# Patient Record
Sex: Female | Born: 1950 | Race: White | Hispanic: No | Marital: Married | Smoking: Never smoker
Health system: Southern US, Community
[De-identification: ages and names within clinical notes are randomized; demographics above are authoritative.]

## PROBLEM LIST (undated history)

## (undated) DIAGNOSIS — E782 Mixed hyperlipidemia: Secondary | ICD-10-CM

## (undated) DIAGNOSIS — M1990 Unspecified osteoarthritis, unspecified site: Secondary | ICD-10-CM

## (undated) DIAGNOSIS — Z973 Presence of spectacles and contact lenses: Secondary | ICD-10-CM

## (undated) DIAGNOSIS — R5383 Other fatigue: Secondary | ICD-10-CM

## (undated) DIAGNOSIS — Z8619 Personal history of other infectious and parasitic diseases: Secondary | ICD-10-CM

## (undated) DIAGNOSIS — K219 Gastro-esophageal reflux disease without esophagitis: Secondary | ICD-10-CM

## (undated) DIAGNOSIS — M51369 Other intervertebral disc degeneration, lumbar region without mention of lumbar back pain or lower extremity pain: Secondary | ICD-10-CM

## (undated) DIAGNOSIS — M5136 Other intervertebral disc degeneration, lumbar region: Secondary | ICD-10-CM

## (undated) DIAGNOSIS — D1803 Hemangioma of intra-abdominal structures: Secondary | ICD-10-CM

## (undated) DIAGNOSIS — R112 Nausea with vomiting, unspecified: Secondary | ICD-10-CM

## (undated) DIAGNOSIS — M797 Fibromyalgia: Secondary | ICD-10-CM

## (undated) DIAGNOSIS — I10 Essential (primary) hypertension: Secondary | ICD-10-CM

## (undated) DIAGNOSIS — G510 Bell's palsy: Secondary | ICD-10-CM

## (undated) DIAGNOSIS — Z9889 Other specified postprocedural states: Secondary | ICD-10-CM

## (undated) HISTORY — DX: Personal history of other infectious and parasitic diseases: Z86.19

## (undated) HISTORY — DX: Other intervertebral disc degeneration, lumbar region: M51.36

## (undated) HISTORY — DX: Mixed hyperlipidemia: E78.2

## (undated) HISTORY — DX: Bell's palsy: G51.0

## (undated) HISTORY — DX: Hemangioma of intra-abdominal structures: D18.03

## (undated) HISTORY — DX: Other intervertebral disc degeneration, lumbar region without mention of lumbar back pain or lower extremity pain: M51.369

## (undated) HISTORY — DX: Unspecified osteoarthritis, unspecified site: M19.90

## (undated) HISTORY — PX: TUBAL LIGATION: SHX77

## (undated) HISTORY — DX: Other fatigue: R53.83

## (undated) HISTORY — PX: COLONOSCOPY: SHX174

## (undated) HISTORY — DX: Fibromyalgia: M79.7

## (undated) HISTORY — DX: Essential (primary) hypertension: I10

---

## 2009-09-08 ENCOUNTER — Ambulatory Visit: Payer: Self-pay | Admitting: Family Medicine

## 2011-01-04 ENCOUNTER — Ambulatory Visit: Payer: Self-pay | Admitting: Family Medicine

## 2012-03-20 ENCOUNTER — Ambulatory Visit: Payer: Self-pay | Admitting: Family Medicine

## 2012-05-02 LAB — HM COLONOSCOPY

## 2013-09-16 ENCOUNTER — Ambulatory Visit: Payer: Self-pay | Admitting: Family Medicine

## 2013-09-18 ENCOUNTER — Ambulatory Visit: Payer: Self-pay | Admitting: Family Medicine

## 2014-08-12 ENCOUNTER — Encounter: Payer: Self-pay | Admitting: Family Medicine

## 2014-08-25 ENCOUNTER — Encounter: Payer: Self-pay | Admitting: Family Medicine

## 2014-08-25 ENCOUNTER — Ambulatory Visit (INDEPENDENT_AMBULATORY_CARE_PROVIDER_SITE_OTHER): Payer: BLUE CROSS/BLUE SHIELD | Admitting: Family Medicine

## 2014-08-25 VITALS — BP 132/78 | HR 69 | Temp 97.3°F | Ht 61.2 in | Wt 196.0 lb

## 2014-08-25 DIAGNOSIS — M797 Fibromyalgia: Secondary | ICD-10-CM | POA: Insufficient documentation

## 2014-08-25 DIAGNOSIS — Z0000 Encounter for general adult medical examination without abnormal findings: Secondary | ICD-10-CM

## 2014-08-25 LAB — URINALYSIS, ROUTINE W REFLEX MICROSCOPIC
Bilirubin, UA: NEGATIVE
Glucose, UA: NEGATIVE
Ketones, UA: NEGATIVE
Leukocytes, UA: NEGATIVE
NITRITE UA: NEGATIVE
PH UA: 7 (ref 5.0–7.5)
Protein, UA: NEGATIVE
RBC, UA: NEGATIVE
Specific Gravity, UA: 1.015 (ref 1.005–1.030)
Urobilinogen, Ur: 0.2 mg/dL (ref 0.2–1.0)

## 2014-08-25 MED ORDER — HYDROCHLOROTHIAZIDE 25 MG PO TABS: 25.0000 mg | ORAL_TABLET | Freq: Every day | ORAL | Status: DC

## 2014-08-25 NOTE — Progress Notes (Signed)
BP 132/78 mmHg  Pulse 69  Temp(Src) 97.3 F (36.3 C)  Ht 5' 1.2" (1.554 m)  Wt 196 lb (88.905 kg)  BMI 36.81 kg/m2  SpO2 97%   Subjective:    Patient ID: Jessica Mcgee, female    DOB: 1950/05/08, 64 y.o.   MRN: [id number]  HPI: Jessica Mcgee is a 64 y.o. female  Chief Complaint  Patient presents with  . Annual Exam  . Fibromyalgia  has had a very stressful month and fibromyalgia is worse. Does not want to move  Not taking meloxicam as does not do anything.   Relevant past medical, surgical, family and social history reviewed and updated as indicated. Interim medical history since our last visit reviewed. Allergies and medications reviewed and updated.  Review of Systems  Constitutional: Negative.   HENT: Negative.   Eyes: Negative.   Respiratory: Negative.   Cardiovascular: Negative.   Gastrointestinal: Negative.   Endocrine: Negative.   Genitourinary: Negative.   Musculoskeletal: Negative.   Skin: Negative.   Allergic/Immunologic: Negative.   Neurological: Negative.   Hematological: Negative.   Psychiatric/Behavioral: Negative.     Per HPI unless specifically indicated above     Objective:    BP 132/78 mmHg  Pulse 69  Temp(Src) 97.3 F (36.3 C)  Ht 5' 1.2" (1.554 m)  Wt 196 lb (88.905 kg)  BMI 36.81 kg/m2  SpO2 97%  Wt Readings from Last 3 Encounters:  08/25/14 196 lb (88.905 kg)  07/03/14 195 lb (88.451 kg)    Physical Exam  Constitutional: She is oriented to person, place, and time. She appears well-developed and well-nourished.  HENT:  Head: Normocephalic and atraumatic.  Right Ear: External ear normal.  Left Ear: External ear normal.  Nose: Nose normal.  Mouth/Throat: Oropharynx is clear and moist.  Eyes: Conjunctivae and EOM are normal. Pupils are equal, round, and reactive to light.  Neck: Normal range of motion. Neck supple. Carotid bruit is not present.  Cardiovascular: Normal rate, regular rhythm and normal heart sounds.   No  murmur heard. Pulmonary/Chest: Effort normal and breath sounds normal. Right breast exhibits no mass and no tenderness. Left breast exhibits no mass and no tenderness. Breasts are symmetrical.  Abdominal: Soft. Bowel sounds are normal. There is no hepatosplenomegaly.  Musculoskeletal: Normal range of motion.  Neurological: She is alert and oriented to person, place, and time.  Skin: No rash noted.  Psychiatric: She has a normal mood and affect. Her behavior is normal. Judgment and thought content normal.    No results found for this or any previous visit.    Assessment & Plan:   Problem List Items Addressed This Visit      Musculoskeletal and Integument   Fibromyalgia - Primary (Chronic)    In middle of flair,  Will rest and exercise,  Check labs and vit levels. Pay attn nutrition      Relevant Medications   hydrochlorothiazide (HYDRODIURIL) 25 MG tablet   Other Relevant Orders   Comprehensive metabolic panel   CBC with Differential/Platelet   Urinalysis, Routine w reflex microscopic (not at [facility])   TSH   Lipid panel   Calcitriol (1,25 di-OH Vit D)   Vitamin B12    Other Visit Diagnoses    PE (physical exam), annual        Relevant Medications    hydrochlorothiazide (HYDRODIURIL) 25 MG tablet    Other Relevant Orders    Comprehensive metabolic panel    CBC with Differential/Platelet  Urinalysis, Routine w reflex microscopic (not at [facility])    TSH    Lipid panel    Calcitriol (1,25 di-OH Vit D)    Vitamin B12        Follow up plan: Return if symptoms worsen or fail to improve, for otherwise 6 mo.

## 2014-08-25 NOTE — Assessment & Plan Note (Signed)
In middle of flair,  Will rest and exercise,  Check labs and vit levels. Pay attn nutrition

## 2014-08-26 LAB — LIPID PANEL
CHOL/HDL RATIO: 3.1 ratio (ref 0.0–4.4)
CHOLESTEROL TOTAL: 207 mg/dL — AB (ref 100–199)
HDL: 67 mg/dL (ref >39)
LDL Calculated: 102 mg/dL — ABNORMAL HIGH (ref 0–99)
TRIGLYCERIDES: 191 mg/dL — AB (ref 0–149)
VLDL Cholesterol Cal: 38 mg/dL (ref 5–40)

## 2014-08-26 LAB — CBC WITH DIFFERENTIAL/PLATELET
BASOS ABS: 0 10*3/uL (ref 0.0–0.2)
Basos: 1 %
EOS (ABSOLUTE): 0.1 10*3/uL (ref 0.0–0.4)
Eos: 1 %
HEMATOCRIT: 40.2 % (ref 34.0–46.6)
Hemoglobin: 13.4 g/dL (ref 11.1–15.9)
Immature Grans (Abs): 0 10*3/uL (ref 0.0–0.1)
Immature Granulocytes: 0 %
LYMPHS: 38 %
Lymphocytes Absolute: 2.9 10*3/uL (ref 0.7–3.1)
MCH: 28.9 pg (ref 26.6–33.0)
MCHC: 33.3 g/dL (ref 31.5–35.7)
MCV: 87 fL (ref 79–97)
MONOCYTES: 8 %
MONOS ABS: 0.6 10*3/uL (ref 0.1–0.9)
Neutrophils Absolute: 4.1 10*3/uL (ref 1.4–7.0)
Neutrophils: 52 %
PLATELETS: 248 10*3/uL (ref 150–379)
RBC: 4.64 x10E6/uL (ref 3.77–5.28)
RDW: 14 % (ref 12.3–15.4)
WBC: 7.8 10*3/uL (ref 3.4–10.8)

## 2014-08-26 LAB — COMPREHENSIVE METABOLIC PANEL
A/G RATIO: 1.6 (ref 1.1–2.5)
ALT: 29 IU/L (ref 0–32)
AST: 22 IU/L (ref 0–40)
Albumin: 4 g/dL (ref 3.6–4.8)
Alkaline Phosphatase: 47 IU/L (ref 39–117)
BUN / CREAT RATIO: 26 (ref 11–26)
BUN: 16 mg/dL (ref 8–27)
Bilirubin Total: 0.3 mg/dL (ref 0.0–1.2)
CALCIUM: 9.8 mg/dL (ref 8.7–10.3)
CO2: 27 mmol/L (ref 18–29)
Chloride: 98 mmol/L (ref 97–108)
Creatinine, Ser: 0.61 mg/dL (ref 0.57–1.00)
GFR calc Af Amer: 113 mL/min/{1.73_m2} (ref >59)
GFR calc non Af Amer: 98 mL/min/{1.73_m2} (ref >59)
Globulin, Total: 2.5 g/dL (ref 1.5–4.5)
Glucose: 96 mg/dL (ref 65–99)
Potassium: 3.7 mmol/L (ref 3.5–5.2)
SODIUM: 139 mmol/L (ref 134–144)
Total Protein: 6.5 g/dL (ref 6.0–8.5)

## 2014-08-26 LAB — VITAMIN B12: Vitamin B-12: 1196 pg/mL — ABNORMAL HIGH (ref 211–946)

## 2014-08-26 LAB — TSH: TSH: 2.3 u[IU]/mL (ref 0.450–4.500)

## 2014-08-26 LAB — VITAMIN D 25 HYDROXY (VIT D DEFICIENCY, FRACTURES): Vit D, 25-Hydroxy: 41.9 ng/mL (ref 30.0–100.0)

## 2014-08-27 ENCOUNTER — Telehealth: Payer: Self-pay | Admitting: Family Medicine

## 2014-08-27 DIAGNOSIS — M797 Fibromyalgia: Secondary | ICD-10-CM

## 2014-08-27 NOTE — Telephone Encounter (Signed)
-----   Message from Wynn Maudlin, [employer] sent at 08/27/2014  4:53 PM EDT ----- Line [phone]

## 2014-08-27 NOTE — Telephone Encounter (Signed)
pts labs normal  Discussed fibromyalgia worse will refer to rheumatology

## 2014-08-27 NOTE — Telephone Encounter (Signed)
-----   Message from Wynn Maudlin, [place] sent at 08/27/2014  4:53 PM EDT ----- Line [phone]

## 2015-02-10 ENCOUNTER — Encounter: Payer: Self-pay | Admitting: Family Medicine

## 2015-02-10 ENCOUNTER — Ambulatory Visit (INDEPENDENT_AMBULATORY_CARE_PROVIDER_SITE_OTHER): Payer: BLUE CROSS/BLUE SHIELD | Admitting: Family Medicine

## 2015-02-10 VITALS — BP 112/67 | HR 73 | Temp 98.5°F | Ht 61.3 in | Wt 203.0 lb

## 2015-02-10 DIAGNOSIS — I10 Essential (primary) hypertension: Secondary | ICD-10-CM

## 2015-02-10 DIAGNOSIS — M797 Fibromyalgia: Secondary | ICD-10-CM | POA: Diagnosis not present

## 2015-02-10 DIAGNOSIS — Z1239 Encounter for other screening for malignant neoplasm of breast: Secondary | ICD-10-CM | POA: Diagnosis not present

## 2015-02-10 NOTE — Progress Notes (Signed)
BP 112/67 mmHg  Pulse 73  Temp(Src) 98.5 F (36.9 C)  Ht 5' 1.3" (1.557 m)  Wt 203 lb (92.08 kg)  BMI 37.98 kg/m2  SpO2 99%   Subjective:    Patient ID: Jessica Mcgee, female    DOB: [birth year], 64 y.o.   MRN: [id number]  HPI: [name] is a 64 y.o. female  Chief Complaint  Patient presents with  . Fibromyalgia   she doing okay with fibromyalgia Has occasional bad day needs handicap sticker for parking on those days when feet hurt for any further walking. Blood pressure doing okay with medications no issues Other conditions seem to be stable for now Agents had a lot of stress but is managing  Relevant past medical, surgical, family and social history reviewed and updated as indicated. Interim medical history since our last visit reviewed. Allergies and medications reviewed and updated.  Review of Systems  Constitutional: Negative.   Respiratory: Negative.   Cardiovascular: Negative.     Per HPI unless specifically indicated above     Objective:    BP 112/67 mmHg  Pulse 73  Temp(Src) 98.5 F (36.9 C)  Ht 5' 1.3" (1.557 m)  Wt 203 lb (92.08 kg)  BMI 37.98 kg/m2  SpO2 99%  Wt Readings from Last 3 Encounters:  02/10/15 203 lb (92.08 kg)  08/25/14 196 lb (88.905 kg)  07/03/14 195 lb (88.451 kg)    Physical Exam  Constitutional: She is oriented to person, place, and time. She appears well-developed and well-nourished. No distress.  HENT:  Head: Normocephalic and atraumatic.  Right Ear: Hearing normal.  Left Ear: Hearing normal.  Nose: Nose normal.  Eyes: Conjunctivae and lids are normal. Right eye exhibits no discharge. Left eye exhibits no discharge. No scleral icterus.  Cardiovascular: Normal rate, regular rhythm and normal heart sounds.   Pulmonary/Chest: Effort normal and breath sounds normal. No respiratory distress.  Musculoskeletal: Normal range of motion.  Neurological: She is alert and oriented to person, place, and time.  Skin: Skin is  intact. No rash noted.  Psychiatric: She has a normal mood and affect. Her speech is normal and behavior is normal. Judgment and thought content normal. Cognition and memory are normal.    Results for orders placed or performed in visit on 08/25/14  Comprehensive metabolic panel  Result Value Ref Range   Glucose 96 65 - 99 mg/dL   BUN 16 8 - 27 mg/dL   Creatinine, Ser 0.61 0.57 - 1.00 mg/dL   GFR calc non Af Amer 98 >59 mL/min/1.73   GFR calc Af Amer 113 >59 mL/min/1.73   BUN/Creatinine Ratio 26 11 - 26   Sodium 139 134 - 144 mmol/L   Potassium 3.7 3.5 - 5.2 mmol/L   Chloride 98 97 - 108 mmol/L   CO2 27 18 - 29 mmol/L   Calcium 9.8 8.7 - 10.3 mg/dL   Total Protein 6.5 6.0 - 8.5 g/dL   Albumin 4.0 3.6 - 4.8 g/dL   Globulin, Total 2.5 1.5 - 4.5 g/dL   Albumin/Globulin Ratio 1.6 1.1 - 2.5   Bilirubin Total 0.3 0.0 - 1.2 mg/dL   Alkaline Phosphatase 47 39 - 117 IU/L   AST 22 0 - 40 IU/L   ALT 29 0 - 32 IU/L  CBC with Differential/Platelet  Result Value Ref Range   WBC 7.8 3.4 - 10.8 x10E3/uL   RBC 4.64 3.77 - 5.28 x10E6/uL   Hemoglobin 13.4 11.1 - 15.9 g/dL   Hematocrit  40.2 34.0 - 46.6 %   MCV 87 79 - 97 fL   MCH 28.9 26.6 - 33.0 pg   MCHC 33.3 31.5 - 35.7 g/dL   RDW 14.0 12.3 - 15.4 %   Platelets 248 150 - 379 x10E3/uL   Neutrophils 52 %   Lymphs 38 %   Monocytes 8 %   Eos 1 %   Basos 1 %   Neutrophils Absolute 4.1 1.4 - 7.0 x10E3/uL   Lymphocytes Absolute 2.9 0.7 - 3.1 x10E3/uL   Monocytes Absolute 0.6 0.1 - 0.9 x10E3/uL   EOS (ABSOLUTE) 0.1 0.0 - 0.4 x10E3/uL   Basophils Absolute 0.0 0.0 - 0.2 x10E3/uL   Immature Granulocytes 0 %   Immature Grans (Abs) 0.0 0.0 - 0.1 x10E3/uL  Urinalysis, Routine w reflex microscopic (not at [facility])  Result Value Ref Range   Specific Gravity, UA 1.015 1.005 - 1.030   pH, UA 7.0 5.0 - 7.5   Color, UA Yellow Yellow   Appearance Ur Clear Clear   Leukocytes, UA Negative Negative   Protein, UA Negative Negative/Trace   Glucose, UA  Negative Negative   Ketones, UA Negative Negative   RBC, UA Negative Negative   Bilirubin, UA Negative Negative   Urobilinogen, Ur 0.2 0.2 - 1.0 mg/dL   Nitrite, UA Negative Negative  TSH  Result Value Ref Range   TSH 2.300 0.450 - 4.500 uIU/mL  Lipid panel  Result Value Ref Range   Cholesterol, Total 207 (H) 100 - 199 mg/dL   Triglycerides 191 (H) 0 - 149 mg/dL   HDL 67 >39 mg/dL   VLDL Cholesterol Cal 38 5 - 40 mg/dL   LDL Calculated 102 (H) 0 - 99 mg/dL   Chol/HDL Ratio 3.1 0.0 - 4.4 ratio units  Vitamin B12  Result Value Ref Range   Vitamin B-12 1196 (H) 211 - 946 pg/mL  Vit D  25 hydroxy (rtn osteoporosis monitoring)  Result Value Ref Range   Vit D, 25-Hydroxy 41.9 30.0 - 100.0 ng/mL      Assessment & Plan:   Problem List Items Addressed This Visit      Cardiovascular and Mediastinum   Essential hypertension    The current medical regimen is effective;  continue present plan and medications.       Relevant Orders   Basic metabolic panel     Musculoskeletal and Integument   Fibromyalgia (Chronic)    The current medical regimen is effective;  continue present plan and medications.        Other Visit Diagnoses    Breast cancer screening    -  Primary    Relevant Orders    MM Digital Screening        Follow up plan: Return in about 6 months (around 08/11/2015) for Physical Exam.

## 2015-02-10 NOTE — Assessment & Plan Note (Signed)
The current medical regimen is effective;  continue present plan and medications.  

## 2015-02-11 ENCOUNTER — Telehealth: Payer: Self-pay | Admitting: Family Medicine

## 2015-02-11 DIAGNOSIS — R7309 Other abnormal glucose: Secondary | ICD-10-CM

## 2015-02-11 DIAGNOSIS — E876 Hypokalemia: Secondary | ICD-10-CM

## 2015-02-11 LAB — BASIC METABOLIC PANEL
BUN / CREAT RATIO: 26 (ref 11–26)
BUN: 16 mg/dL (ref 8–27)
CO2: 26 mmol/L (ref 18–29)
CREATININE: 0.61 mg/dL (ref 0.57–1.00)
Calcium: 9.7 mg/dL (ref 8.7–10.3)
Chloride: 98 mmol/L (ref 97–106)
GFR calc non Af Amer: 97 mL/min/{1.73_m2} (ref >59)
GFR, EST AFRICAN AMERICAN: 112 mL/min/{1.73_m2} (ref >59)
GLUCOSE: 136 mg/dL — AB (ref 65–99)
Potassium: 3.3 mmol/L — ABNORMAL LOW (ref 3.5–5.2)
Sodium: 139 mmol/L (ref 136–144)

## 2015-02-11 NOTE — Telephone Encounter (Signed)
-----   Message from Wynn Maudlin, [place] sent at 02/11/2015 12:21 PM EST ----- labs

## 2015-02-11 NOTE — Telephone Encounter (Signed)
Phone call Discussed with patient Elevated glucose and low potassium Patient will increase potassium in diet We will recheck BMP and hemoglobin A1c next month

## 2015-03-15 ENCOUNTER — Ambulatory Visit: Payer: Self-pay

## 2015-03-22 ENCOUNTER — Ambulatory Visit
Admission: RE | Admit: 2015-03-22 | Discharge: 2015-03-22 | Disposition: A | Discharge status: Home / Self Care | Payer: BLUE CROSS/BLUE SHIELD | Source: Ambulatory Visit | Attending: Family Medicine | Admitting: Family Medicine

## 2015-03-22 DIAGNOSIS — Z1231 Encounter for screening mammogram for malignant neoplasm of breast: Secondary | ICD-10-CM | POA: Diagnosis present

## 2015-03-22 DIAGNOSIS — Z1239 Encounter for other screening for malignant neoplasm of breast: Secondary | ICD-10-CM

## 2015-06-04 ENCOUNTER — Encounter: Payer: Self-pay | Admitting: Family Medicine

## 2015-07-14 ENCOUNTER — Other Ambulatory Visit: Payer: Self-pay | Admitting: Family Medicine

## 2015-09-01 ENCOUNTER — Encounter: Payer: BLUE CROSS/BLUE SHIELD | Admitting: Family Medicine

## 2015-10-07 ENCOUNTER — Ambulatory Visit (INDEPENDENT_AMBULATORY_CARE_PROVIDER_SITE_OTHER): Payer: BLUE CROSS/BLUE SHIELD | Admitting: Family Medicine

## 2015-10-07 ENCOUNTER — Encounter: Payer: Self-pay | Admitting: Family Medicine

## 2015-10-07 VITALS — BP 130/76 | HR 74 | Temp 98.0°F | Ht 60.7 in | Wt 204.0 lb

## 2015-10-07 DIAGNOSIS — M797 Fibromyalgia: Secondary | ICD-10-CM

## 2015-10-07 DIAGNOSIS — Z124 Encounter for screening for malignant neoplasm of cervix: Secondary | ICD-10-CM | POA: Diagnosis not present

## 2015-10-07 DIAGNOSIS — Z0000 Encounter for general adult medical examination without abnormal findings: Secondary | ICD-10-CM | POA: Diagnosis not present

## 2015-10-07 DIAGNOSIS — I10 Essential (primary) hypertension: Secondary | ICD-10-CM | POA: Diagnosis not present

## 2015-10-07 DIAGNOSIS — K219 Gastro-esophageal reflux disease without esophagitis: Secondary | ICD-10-CM | POA: Diagnosis not present

## 2015-10-07 LAB — URINALYSIS, ROUTINE W REFLEX MICROSCOPIC
BILIRUBIN UA: NEGATIVE
Glucose, UA: NEGATIVE
Ketones, UA: NEGATIVE
NITRITE UA: NEGATIVE
PH UA: 6 (ref 5.0–7.5)
Protein, UA: NEGATIVE
RBC UA: NEGATIVE
Specific Gravity, UA: 1.015 (ref 1.005–1.030)
UUROB: 0.2 mg/dL (ref 0.2–1.0)

## 2015-10-07 LAB — MICROSCOPIC EXAMINATION

## 2015-10-07 MED ORDER — HYDROCHLOROTHIAZIDE 25 MG PO TABS: 25.0000 mg | ORAL_TABLET | Freq: Every day | ORAL | 4 refills | Status: DC

## 2015-10-07 NOTE — Assessment & Plan Note (Signed)
The current medical regimen is effective;  continue present plan and medications.  

## 2015-10-07 NOTE — Progress Notes (Signed)
BP 130/76 (BP Location: Left Arm, Patient Position: Sitting, Cuff Size: Normal)   Pulse 74   Temp 98 F (36.7 C)   Ht 5' 0.7" (1.542 m)   Wt 204 lb (92.5 kg)   SpO2 98%   BMI 38.93 kg/m    Subjective:    Patient ID: Jessica Mcgee, female    DOB: June 19, 1950, 65 y.o.   MRN: [id number]  HPI: Jessica Mcgee is a 65 y.o. female  Annual exam Patient follow-up medical exam doing well blood pressure hydrochlorothiazide no complaints takes faithfully every day. Reflux gastric hemangiomas well controlled with alternating Prilosec with Pepcid. Does well no complaints from medications no GI complaints no bleeding. Ambien last prescription April 2016 still many pills left and bottle takes rare pill and only a portion of the 10 mg pill. Fibromyalgia continues to be a problem but manages okay  Relevant past medical, surgical, family and social history reviewed and updated as indicated. Interim medical history since our last visit reviewed. Allergies and medications reviewed and updated.  Review of Systems  Constitutional: Negative.   HENT: Negative.   Eyes: Negative.   Respiratory: Negative.   Cardiovascular: Negative.   Gastrointestinal: Negative.   Endocrine: Negative.   Genitourinary: Negative.   Musculoskeletal: Negative.   Skin: Negative.   Allergic/Immunologic: Negative.   Neurological: Negative.   Hematological: Negative.   Psychiatric/Behavioral: Negative.     Per HPI unless specifically indicated above     Objective:    BP 130/76 (BP Location: Left Arm, Patient Position: Sitting, Cuff Size: Normal)   Pulse 74   Temp 98 F (36.7 C)   Ht 5' 0.7" (1.542 m)   Wt 204 lb (92.5 kg)   SpO2 98%   BMI 38.93 kg/m   Wt Readings from Last 3 Encounters:  10/07/15 204 lb (92.5 kg)  02/10/15 203 lb (92.1 kg)  08/25/14 196 lb (88.9 kg)    Physical Exam  Constitutional: She is oriented to person, place, and time. She appears well-developed and well-nourished.  HENT:  Head:  Normocephalic and atraumatic.  Right Ear: External ear normal.  Left Ear: External ear normal.  Nose: Nose normal.  Mouth/Throat: Oropharynx is clear and moist.  Eyes: Conjunctivae and EOM are normal. Pupils are equal, round, and reactive to light.  Neck: Normal range of motion. Neck supple. Carotid bruit is not present.  Cardiovascular: Normal rate, regular rhythm and normal heart sounds.   No murmur heard. Pulmonary/Chest: Effort normal and breath sounds normal. She exhibits no mass. Right breast exhibits no mass, no skin change and no tenderness. Left breast exhibits no mass, no skin change and no tenderness. Breasts are symmetrical.  Abdominal: Soft. Bowel sounds are normal. There is no hepatosplenomegaly.  Genitourinary: Vagina normal and uterus normal. No vaginal discharge found.  Genitourinary Comments: Normal bimanual exam Pap  performed  Musculoskeletal: Normal range of motion.  Neurological: She is alert and oriented to person, place, and time.  Skin: No rash noted.  Psychiatric: She has a normal mood and affect. Her behavior is normal. Judgment and thought content normal.    Results for orders placed or performed in visit on [id number]  Basic metabolic panel  Result Value Ref Range   Glucose 136 (H) 65 - 99 mg/dL   BUN 16 8 - 27 mg/dL   Creatinine, Ser 0.61 0.57 - 1.00 mg/dL   GFR calc non Af Amer 97 >59 mL/min/1.73   GFR calc Af Amer 112 >59 mL/min/1.73  BUN/Creatinine Ratio 26 11 - 26   Sodium 139 136 - 144 mmol/L   Potassium 3.3 (L) 3.5 - 5.2 mmol/L   Chloride 98 97 - 106 mmol/L   CO2 26 18 - 29 mmol/L   Calcium 9.7 8.7 - 10.3 mg/dL      Assessment & Plan:   Problem List Items Addressed This Visit      Cardiovascular and Mediastinum   Essential hypertension    The current medical regimen is effective;  continue present plan and medications.         Digestive   Acid reflux    The current medical regimen is effective;  continue present plan and  medications.         Musculoskeletal and Integument   Fibromyalgia (Chronic)    The current medical regimen is effective;  continue present plan and medications.        Other Visit Diagnoses    Annual physical exam    -  Primary   Relevant Orders   CBC with Differential/Platelet   Comprehensive metabolic panel   Lipid Panel w/o Chol/HDL Ratio   TSH   Urinalysis, Routine w reflex microscopic (not at [facility])   Healthcare maintenance       Relevant Orders   Hepatitis C Antibody   Pap smear for cervical cancer screening       Relevant Orders   IGP, Aptima HPV, rfx 16/18,45       Follow up plan: Return in about 6 months (around 04/08/2016) for BMP BP check.

## 2015-10-08 ENCOUNTER — Telehealth: Payer: Self-pay

## 2015-10-08 LAB — CBC WITH DIFFERENTIAL/PLATELET
BASOS: 0 %
Basophils Absolute: 0 10*3/uL (ref 0.0–0.2)
EOS (ABSOLUTE): 0.1 10*3/uL (ref 0.0–0.4)
EOS: 1 %
HEMATOCRIT: 40.2 % (ref 34.0–46.6)
Hemoglobin: 13.2 g/dL (ref 11.1–15.9)
Immature Grans (Abs): 0 10*3/uL (ref 0.0–0.1)
Immature Granulocytes: 0 %
LYMPHS ABS: 3 10*3/uL (ref 0.7–3.1)
Lymphs: 32 %
MCH: 29.2 pg (ref 26.6–33.0)
MCHC: 32.8 g/dL (ref 31.5–35.7)
MCV: 89 fL (ref 79–97)
MONOS ABS: 0.6 10*3/uL (ref 0.1–0.9)
Monocytes: 6 %
NEUTROS ABS: 5.5 10*3/uL (ref 1.4–7.0)
Neutrophils: 61 %
Platelets: 282 10*3/uL (ref 150–379)
RBC: 4.52 x10E6/uL (ref 3.77–5.28)
RDW: 13.7 % (ref 12.3–15.4)
WBC: 9.2 10*3/uL (ref 3.4–10.8)

## 2015-10-08 LAB — COMPREHENSIVE METABOLIC PANEL
A/G RATIO: 1.5 (ref 1.2–2.2)
ALK PHOS: 57 IU/L (ref 39–117)
ALT: 29 IU/L (ref 0–32)
AST: 23 IU/L (ref 0–40)
Albumin: 4.1 g/dL (ref 3.6–4.8)
BILIRUBIN TOTAL: 0.2 mg/dL (ref 0.0–1.2)
BUN/Creatinine Ratio: 31 — ABNORMAL HIGH (ref 12–28)
BUN: 19 mg/dL (ref 8–27)
CO2: 27 mmol/L (ref 18–29)
Calcium: 9.5 mg/dL (ref 8.7–10.3)
Chloride: 97 mmol/L (ref 96–106)
Creatinine, Ser: 0.62 mg/dL (ref 0.57–1.00)
GFR calc Af Amer: 111 mL/min/{1.73_m2} (ref >59)
GFR calc non Af Amer: 96 mL/min/{1.73_m2} (ref >59)
GLOBULIN, TOTAL: 2.7 g/dL (ref 1.5–4.5)
Glucose: 91 mg/dL (ref 65–99)
POTASSIUM: 3.7 mmol/L (ref 3.5–5.2)
SODIUM: 141 mmol/L (ref 134–144)
Total Protein: 6.8 g/dL (ref 6.0–8.5)

## 2015-10-08 LAB — LIPID PANEL W/O CHOL/HDL RATIO
CHOLESTEROL TOTAL: 204 mg/dL — AB (ref 100–199)
HDL: 54 mg/dL (ref >39)
LDL Calculated: 111 mg/dL — ABNORMAL HIGH (ref 0–99)
TRIGLYCERIDES: 196 mg/dL — AB (ref 0–149)
VLDL Cholesterol Cal: 39 mg/dL (ref 5–40)

## 2015-10-08 LAB — HEPATITIS C ANTIBODY: Hep C Virus Ab: <0.1 s/co ratio (ref 0.0–0.9)

## 2015-10-08 LAB — TSH: TSH: 2.04 u[IU]/mL (ref 0.450–4.500)

## 2015-10-08 NOTE — Telephone Encounter (Signed)
Patient reported to me at her CPE 10/07/15 that she had had another colonoscopy since our last documented one in 2010.  LVM requesting report from that procedure for documentation  Fax [phone], we may need to have patient sign release, but we were the original referring practice.

## 2015-10-11 ENCOUNTER — Encounter: Payer: Self-pay | Admitting: Family Medicine

## 2015-10-12 LAB — IGP, APTIMA HPV, RFX 16/18,45
HPV Aptima: NEGATIVE
PAP Smear Comment: 0

## 2016-03-21 ENCOUNTER — Other Ambulatory Visit: Payer: Self-pay | Admitting: Family Medicine

## 2016-03-21 DIAGNOSIS — Z1231 Encounter for screening mammogram for malignant neoplasm of breast: Secondary | ICD-10-CM

## 2016-03-27 ENCOUNTER — Ambulatory Visit
Admission: RE | Admit: 2016-03-27 | Discharge: 2016-03-27 | Disposition: A | Discharge status: Home / Self Care | Payer: BLUE CROSS/BLUE SHIELD | Source: Ambulatory Visit | Attending: Family Medicine | Admitting: Family Medicine

## 2016-03-27 DIAGNOSIS — Z1231 Encounter for screening mammogram for malignant neoplasm of breast: Secondary | ICD-10-CM | POA: Insufficient documentation

## 2016-04-19 ENCOUNTER — Ambulatory Visit (INDEPENDENT_AMBULATORY_CARE_PROVIDER_SITE_OTHER): Payer: BLUE CROSS/BLUE SHIELD | Admitting: Family Medicine

## 2016-04-19 VITALS — BP 120/72 | HR 68 | Ht 62.0 in | Wt 207.0 lb

## 2016-04-19 DIAGNOSIS — M797 Fibromyalgia: Secondary | ICD-10-CM | POA: Diagnosis not present

## 2016-04-19 DIAGNOSIS — D1803 Hemangioma of intra-abdominal structures: Secondary | ICD-10-CM | POA: Diagnosis not present

## 2016-04-19 DIAGNOSIS — I10 Essential (primary) hypertension: Secondary | ICD-10-CM | POA: Diagnosis not present

## 2016-04-19 NOTE — Progress Notes (Signed)
   BP 120/72   Pulse 68   Ht 5\' 2"  (1.575 m)   Wt 207 lb (93.9 kg)   SpO2 98%   BMI 37.86 kg/m    Subjective:    Patient ID: Jessica Mcgee, female    DOB: 21-Aug-1950, 66 y.o.   MRN: [id number]  HPI: Jessica Mcgee is a 66 y.o. female  Chief Complaint  Patient presents with  . Follow-up  . Hypertension  Patient follow-up doing well no complaints taking hydrochlorothiazide without problems and potassium supplement. Good blood pressure control no issues. For sleep taking over-the-counter Sam E Uses rare Ambien which I encouraged.  Relevant past medical, surgical, family and social history reviewed and updated as indicated. Interim medical history since our last visit reviewed. Allergies and medications reviewed and updated.  Review of Systems  Constitutional: Negative.   Respiratory: Negative.   Cardiovascular: Negative.     Per HPI unless specifically indicated above     Objective:    BP 120/72   Pulse 68   Ht 5\' 2"  (1.575 m)   Wt 207 lb (93.9 kg)   SpO2 98%   BMI 37.86 kg/m   Wt Readings from Last 3 Encounters:  04/19/16 207 lb (93.9 kg)  10/07/15 204 lb (92.5 kg)  02/10/15 203 lb (92.1 kg)    Physical Exam  Constitutional: She is oriented to person, place, and time. She appears well-developed and well-nourished. No distress.  HENT:  Head: Normocephalic and atraumatic.  Right Ear: Hearing normal.  Left Ear: Hearing normal.  Nose: Nose normal.  Eyes: Conjunctivae and lids are normal. Right eye exhibits no discharge. Left eye exhibits no discharge. No scleral icterus.  Cardiovascular: Normal rate, regular rhythm and normal heart sounds.   Pulmonary/Chest: Effort normal and breath sounds normal. No respiratory distress.  Musculoskeletal: Normal range of motion.  Neurological: She is alert and oriented to person, place, and time.  Skin: Skin is intact. No rash noted.  Psychiatric: She has a normal mood and affect. Her speech is normal and behavior is normal.  Judgment and thought content normal. Cognition and memory are normal.    Results for orders placed or performed in visit on 10/12/15  HM COLONOSCOPY  Result Value Ref Range   HM Colonoscopy See Report See Report, Patient Reported Normal      Assessment & Plan:   Problem List Items Addressed This Visit      Cardiovascular and Mediastinum   Essential hypertension - Primary    The current medical regimen is effective;  continue present plan and medications.       Relevant Orders   Basic metabolic panel     Other   Fibromyalgia (Chronic)    Fibromyalgia doing well taking possibly 10 Fish oil capsules a day without problems      Hemangioma of intra-abdominal structures    Patient on request asking about hemangioma of her liver. At one time this been followed by MRI. Patient with no symptoms signs or any liver complaint on review no metabolic changes of liver function. Reviewed from old practice partner chart and printed out MRI reports to add in the Epic chart of a stable hemangioma. No further imaging or action is been taking since her last MRI. Will check with GI if any further action needs to be taken.          Follow up plan: Return in about 6 months (around 10/17/2016) for Physical Exam.

## 2016-04-19 NOTE — Assessment & Plan Note (Signed)
Fibromyalgia doing well taking possibly 10 Fish oil capsules a day without problems

## 2016-04-19 NOTE — Addendum Note (Signed)
Addended byGolden Pop on: 04/19/2016 02:37 PM   Modules accepted: Orders

## 2016-04-19 NOTE — Assessment & Plan Note (Addendum)
Patient on request asking about hemangioma of her liver. At one time this been followed by MRI. Patient with no symptoms signs or any liver complaint on review no metabolic changes of liver function. Reviewed from old practice partner chart and printed out MRI reports to add in the Epic chart of a stable hemangioma. No further imaging or action is been taking since her last MRI. Will check with GI if any further action needs to be taken.  Discussed with Dr. Vicente Males of GI and will repeat MRI with contrast of abdomen for reassessment of hepatic hemangioma. We will also make an appointment to follow-up with Dr. Vicente Males regarding options for treatment if necessary.

## 2016-04-19 NOTE — Assessment & Plan Note (Signed)
The current medical regimen is effective;  continue present plan and medications.  

## 2016-04-20 ENCOUNTER — Encounter: Payer: Self-pay | Admitting: Family Medicine

## 2016-04-20 LAB — BASIC METABOLIC PANEL
BUN / CREAT RATIO: 25 (ref 12–28)
BUN: 14 mg/dL (ref 8–27)
CHLORIDE: 97 mmol/L (ref 96–106)
CO2: 26 mmol/L (ref 18–29)
CREATININE: 0.57 mg/dL (ref 0.57–1.00)
Calcium: 9.3 mg/dL (ref 8.7–10.3)
GFR calc Af Amer: 114 mL/min/{1.73_m2} (ref >59)
GFR calc non Af Amer: 99 mL/min/{1.73_m2} (ref >59)
GLUCOSE: 79 mg/dL (ref 65–99)
Potassium: 3.5 mmol/L (ref 3.5–5.2)
SODIUM: 141 mmol/L (ref 134–144)

## 2016-05-17 ENCOUNTER — Ambulatory Visit (INDEPENDENT_AMBULATORY_CARE_PROVIDER_SITE_OTHER): Payer: BLUE CROSS/BLUE SHIELD | Admitting: Gastroenterology

## 2016-05-17 ENCOUNTER — Encounter: Payer: Self-pay | Admitting: Gastroenterology

## 2016-05-17 VITALS — BP 128/72 | Ht 62.0 in | Wt 204.8 lb

## 2016-05-17 DIAGNOSIS — D1803 Hemangioma of intra-abdominal structures: Secondary | ICD-10-CM | POA: Diagnosis not present

## 2016-05-17 NOTE — Progress Notes (Signed)
Gastroenterology Consultation  Referring Provider:     Guadalupe Maple, MD Primary Care Physician:  Golden Pop, MD Primary Gastroenterologist:  Dr. Jonathon Bellows  Reason for Consultation:     Hemangioma of liver         HPI:   Jessica Mcgee is a 66 y.o. y/o female referred for consultation & management  by Dr. Golden Pop, MD.    She is known to have a stable hepatic hemangioma per MRI , last imaging back in 2010. It measured 7.3 x 6.8 cm in left lobe of the liver.   Denies any symptoms of abdominal pain. She says that it was initially found when her liver enzymes were high which is normal now. During the work up it was evaluated and she was found to have a hemangioma.   Past Medical History:  Diagnosis Date  . Bell's palsy   . DDD (degenerative disc disease), lumbar   . Fatigue   . Fibromyalgia   . Hemangioma of intra-abdominal structures   . History of Lyme disease     Past Surgical History:  Procedure Laterality Date  . TUBAL LIGATION      Prior to Admission medications   Medication Sig Start Date End Date Taking? Authorizing Provider  famotidine (PEPCID) 10 MG tablet Take 10 mg by mouth daily as needed for heartburn or indigestion.    Historical Provider, MD  hydrochlorothiazide (HYDRODIURIL) 25 MG tablet Take 1 tablet (25 mg total) by mouth daily. 10/07/15   Guadalupe Maple, MD  omeprazole (PRILOSEC) 20 MG capsule Take 20 mg by mouth daily as needed.     Historical Provider, MD  zolpidem (AMBIEN) 10 MG tablet Take 10 mg by mouth at bedtime as needed.     Historical Provider, MD    Family History  Problem Relation Age of Onset  . Hypertension Mother   . Osteoporosis Mother   . Cancer Mother     breast  . Breast cancer Mother     ~9  . Hypertension Father   . Heart attack Father   . Stroke Father   . Kidney disease Maternal Grandmother   . Osteoporosis Maternal Grandmother   . Kidney disease Maternal Grandfather   . Cancer Maternal Grandfather   . Kidney  disease Paternal Grandmother   . Cancer Paternal Grandmother   . Kidney disease Paternal Grandfather   . Breast cancer Paternal Aunt     3 pat aunts     Social History  Substance Use Topics  . Smoking status: Never Smoker  . Smokeless tobacco: Never Used  . Alcohol use 0.0 oz/week     Comment: occassional    Allergies as of 05/17/2016 - Review Complete 04/19/2016  Allergen Reaction Noted  . Cymbalta [duloxetine hcl]  08/25/2014  . Latex  07/03/2014    Review of Systems:    All systems reviewed and negative except where noted in HPI.   Physical Exam:  There were no vitals taken for this visit. No LMP recorded. Patient is postmenopausal. Psych:  Alert and cooperative. Normal mood and affect. General:   Alert,  Well-developed, well-nourished, pleasant and cooperative in NAD Head:  Normocephalic and atraumatic. Eyes:  Sclera clear, no icterus.   Conjunctiva pink. Ears:  Normal auditory acuity. Nose:  No deformity, discharge, or lesions. Mouth:  No deformity or lesions,oropharynx pink & moist. Neck:  Supple; no masses or thyromegaly. Lungs:  Respirations even and unlabored.  Clear throughout to auscultation.   No  wheezes, crackles, or rhonchi. No acute distress. Heart:  Regular rate and rhythm; no murmurs, clicks, rubs, or gallops. Abdomen:  Normal bowel sounds.  No bruits.  Soft, non-tender and non-distended without masses, hepatosplenomegaly or hernias noted.  No guarding or rebound tenderness.    Msk:  Symmetrical without gross deformities. Good, equal movement & strength bilaterally. Pulses:  Normal pulses noted. Extremities:  No clubbing or edema.  No cyanosis. Neurologic:  Alert and oriented x3;  grossly normal neurologically. Psych:  Alert and cooperative. Normal mood and affect.  Imaging Studies: No results found.  Assessment and Plan:   Jessica Mcgee is a 66 y.o. y/o female has been referred for evaluation of a hepatic hemangioma which was stable on MRI till  2010. She has had a repeat MRI recently and shows the largest hemangioma at 6.2 cm which seems to be stable but there are two other hemangiomas also seem which am not sure if were present previously or are new. I am going to try and get the radiologist to compare the two MRI , the present one and the one from 2010 . If stable then needs no further follow up , if there has been a change then would likely need surveillance . Presently no indication for any resection.     Follow up as needed   Dr Jonathon Bellows MD

## 2016-05-18 ENCOUNTER — Telehealth: Payer: Self-pay | Admitting: Gastroenterology

## 2016-05-18 NOTE — Telephone Encounter (Signed)
I spoke to radiology and they said that they do not have images from 2010 MRI  as the radiology practice merged.   Hence we would not be able to compare the two MRI from 2010 and 2018 to check if the hemangiomas havnt changed.   Only option I can see is to repeat MRI in 6-12 months to check for stability of the lesions  Panya ,please inform the patient about this and let me know her thoughts.   C/c Dr Jeananne Rama  Dr Jonathon Bellows  Gastroenterology/Hepatology Pager: [phone]

## 2016-05-18 NOTE — Telephone Encounter (Signed)
-----   Message from Jonathon Bellows, MD sent at 05/18/2016  9:08 AM EDT ----- Regarding: RE: MRI Comparison I called radiology and they will get both films and have it compared. If they call when am on vacation please send me an EPIC message so I can follow up   Jessica Mcgee  ----- Message ----- From: Leontine Locket, CMA Sent: 05/17/2016   3:52 PM To: Jonathon Bellows, MD Subject: MRI Comparison                                 Per Radiology, you have to call them and speak directly to a Radiologist. They will give you the information over the phone.   [phone]

## 2016-05-19 NOTE — Telephone Encounter (Signed)
Panya can we put some form of a reminder for [place] to order the MRI in about 8 months

## 2016-05-24 ENCOUNTER — Other Ambulatory Visit: Payer: Self-pay

## 2016-05-24 ENCOUNTER — Ambulatory Visit
Admission: RE | Admit: 2016-05-24 | Discharge: 2016-05-24 | Disposition: A | Discharge status: Home / Self Care | Payer: Self-pay | Source: Ambulatory Visit | Attending: Gastroenterology | Admitting: Gastroenterology

## 2016-05-24 DIAGNOSIS — D1803 Hemangioma of intra-abdominal structures: Secondary | ICD-10-CM

## 2016-08-01 ENCOUNTER — Telehealth: Payer: Self-pay | Admitting: Gastroenterology

## 2016-08-01 NOTE — Telephone Encounter (Signed)
Patient called and was wondering if Dr. Vicente Males has reviewed the disk that she brought in? Please call

## 2016-08-02 NOTE — Telephone Encounter (Signed)
Advised patient of Dr. Georgeann Oppenheim response concerning MRI disc and stability of lesions.   Anna: I spoke to radiology and they said that they do not have images from 2010 MRI  as the radiology practice merged.   Hence we would not be able to compare the two MRI from 2010 and 2018 to check if the hemangiomas havnt changed.   Only option I can see is to repeat MRI in 6-12 months to check for stability of the lesions  Advised pt to callback to schedule

## 2016-10-23 ENCOUNTER — Encounter: Payer: Self-pay | Admitting: Family Medicine

## 2016-10-23 ENCOUNTER — Ambulatory Visit (INDEPENDENT_AMBULATORY_CARE_PROVIDER_SITE_OTHER): Payer: BLUE CROSS/BLUE SHIELD | Admitting: Family Medicine

## 2016-10-23 VITALS — BP 124/76 | HR 69 | Ht 62.4 in | Wt 208.7 lb

## 2016-10-23 DIAGNOSIS — Z114 Encounter for screening for human immunodeficiency virus [HIV]: Secondary | ICD-10-CM

## 2016-10-23 DIAGNOSIS — M797 Fibromyalgia: Secondary | ICD-10-CM

## 2016-10-23 DIAGNOSIS — Z131 Encounter for screening for diabetes mellitus: Secondary | ICD-10-CM | POA: Diagnosis not present

## 2016-10-23 DIAGNOSIS — Z1329 Encounter for screening for other suspected endocrine disorder: Secondary | ICD-10-CM

## 2016-10-23 DIAGNOSIS — I10 Essential (primary) hypertension: Secondary | ICD-10-CM

## 2016-10-23 DIAGNOSIS — Z13220 Encounter for screening for lipoid disorders: Secondary | ICD-10-CM | POA: Diagnosis not present

## 2016-10-23 DIAGNOSIS — Z0000 Encounter for general adult medical examination without abnormal findings: Secondary | ICD-10-CM

## 2016-10-23 DIAGNOSIS — K219 Gastro-esophageal reflux disease without esophagitis: Secondary | ICD-10-CM

## 2016-10-23 DIAGNOSIS — Z124 Encounter for screening for malignant neoplasm of cervix: Secondary | ICD-10-CM | POA: Diagnosis not present

## 2016-10-23 LAB — URINALYSIS, ROUTINE W REFLEX MICROSCOPIC
Bilirubin, UA: NEGATIVE
Glucose, UA: NEGATIVE
KETONES UA: NEGATIVE
LEUKOCYTES UA: NEGATIVE
Nitrite, UA: NEGATIVE
PH UA: 5.5 (ref 5.0–7.5)
Protein, UA: NEGATIVE
SPEC GRAV UA: 1.01 (ref 1.005–1.030)
Urobilinogen, Ur: 0.2 mg/dL (ref 0.2–1.0)

## 2016-10-23 LAB — MICROSCOPIC EXAMINATION: BACTERIA UA: NONE SEEN

## 2016-10-23 MED ORDER — HYDROCHLOROTHIAZIDE 25 MG PO TABS: 25.0000 mg | ORAL_TABLET | Freq: Every day | ORAL | 4 refills | Status: DC

## 2016-10-23 NOTE — Progress Notes (Signed)
BP 124/76   Pulse 69   Ht 5' 2.4" (1.585 m)   Wt 208 lb 11.2 oz (94.7 kg)   SpO2 96%   BMI 37.68 kg/m    Subjective:    Patient ID: Jessica Mcgee, female    DOB: [birth year], 66 y.o.   MRN: [id number]  HPI: Jessica Mcgee is a 66 y.o. female  Chief Complaint  Patient presents with  . Annual Exam   Patient all in all doing well taking hydrochlorothiazide for blood pressure without problems good control on home checking and no side effects. Takes occasional meds for reflux with good control no issues. Ambien still has medicine left over from 2 years ago from a bottle of 30 given does well and only takes rarely. Fibromyalgia still miserable but does okay otherwise.  Relevant past medical, surgical, family and social history reviewed and updated as indicated. Interim medical history since our last visit reviewed. Allergies and medications reviewed and updated.  Review of Systems  Constitutional: Negative.   HENT: Negative.   Eyes: Negative.   Respiratory: Negative.   Cardiovascular: Negative.   Gastrointestinal: Negative.   Endocrine: Negative.   Genitourinary: Negative.   Musculoskeletal: Negative.   Skin: Negative.   Allergic/Immunologic: Negative.   Neurological: Negative.   Hematological: Negative.   Psychiatric/Behavioral: Negative.     Per HPI unless specifically indicated above     Objective:    BP 124/76   Pulse 69   Ht 5' 2.4" (1.585 m)   Wt 208 lb 11.2 oz (94.7 kg)   SpO2 96%   BMI 37.68 kg/m   Wt Readings from Last 3 Encounters:  10/23/16 208 lb 11.2 oz (94.7 kg)  05/17/16 204 lb 12.8 oz (92.9 kg)  04/19/16 207 lb (93.9 kg)    Physical Exam  Constitutional: She is oriented to person, place, and time. She appears well-developed and well-nourished.  HENT:  Head: Normocephalic and atraumatic.  Right Ear: External ear normal.  Left Ear: External ear normal.  Nose: Nose normal.  Mouth/Throat: Oropharynx is clear and moist.  Eyes: Pupils are  equal, round, and reactive to light. Conjunctivae and EOM are normal.  Neck: Normal range of motion. Neck supple. Carotid bruit is not present.  Cardiovascular: Normal rate, regular rhythm and normal heart sounds.   No murmur heard. Pulmonary/Chest: Effort normal and breath sounds normal. She exhibits no mass. Right breast exhibits no mass, no skin change and no tenderness. Left breast exhibits no mass, no skin change and no tenderness. Breasts are symmetrical.  Abdominal: Soft. Bowel sounds are normal. There is no hepatosplenomegaly.  Genitourinary: Vagina normal and uterus normal.  Musculoskeletal: Normal range of motion.  Neurological: She is alert and oriented to person, place, and time.  Skin: No rash noted.  Psychiatric: She has a normal mood and affect. Her behavior is normal. Judgment and thought content normal.    Results for orders placed or performed in visit on 58/09/98  Basic metabolic panel  Result Value Ref Range   Glucose 79 65 - 99 mg/dL   BUN 14 8 - 27 mg/dL   Creatinine, Ser 0.57 0.57 - 1.00 mg/dL   GFR calc non Af Amer 99 >59 mL/min/1.73   GFR calc Af Amer 114 >59 mL/min/1.73   BUN/Creatinine Ratio 25 12 - 28   Sodium 141 134 - 144 mmol/L   Potassium 3.5 3.5 - 5.2 mmol/L   Chloride 97 96 - 106 mmol/L   CO2 26 18 - 29  mmol/L   Calcium 9.3 8.7 - 10.3 mg/dL      Assessment & Plan:   Problem List Items Addressed This Visit      Cardiovascular and Mediastinum   Essential hypertension - Primary    The current medical regimen is effective;  continue present plan and medications.       Relevant Medications   hydrochlorothiazide (HYDRODIURIL) 25 MG tablet   Other Relevant Orders   CBC with Differential/Platelet     Digestive   Acid reflux    The current medical regimen is effective;  continue present plan and medications.         Other   Fibromyalgia (Chronic)    Stable with lifestyle modification and exercise       Other Visit Diagnoses    Pap  smear for cervical cancer screening       Relevant Orders   IGP, Aptima HPV, rfx 16/18,45   Annual physical exam       Encounter for screening for HIV       Relevant Orders   HIV antibody   Screening for diabetes mellitus (DM)       Relevant Orders   Comprehensive metabolic panel   Urinalysis, Routine w reflex microscopic   Screening cholesterol level       Relevant Orders   Lipid panel   Thyroid disorder screen       Relevant Orders   TSH       Follow up plan: Return in about 6 months (around 04/25/2017) for BMP.

## 2016-10-23 NOTE — Assessment & Plan Note (Signed)
Stable with lifestyle modification and exercise

## 2016-10-23 NOTE — Assessment & Plan Note (Signed)
The current medical regimen is effective;  continue present plan and medications.  

## 2016-10-24 ENCOUNTER — Encounter: Payer: Self-pay | Admitting: Family Medicine

## 2016-10-24 LAB — CBC WITH DIFFERENTIAL/PLATELET
BASOS ABS: 0 10*3/uL (ref 0.0–0.2)
Basos: 0 %
EOS (ABSOLUTE): 0.1 10*3/uL (ref 0.0–0.4)
Eos: 1 %
HEMOGLOBIN: 13.4 g/dL (ref 11.1–15.9)
Hematocrit: 40.8 % (ref 34.0–46.6)
Immature Grans (Abs): 0 10*3/uL (ref 0.0–0.1)
Immature Granulocytes: 0 %
LYMPHS ABS: 2.5 10*3/uL (ref 0.7–3.1)
Lymphs: 33 %
MCH: 28.9 pg (ref 26.6–33.0)
MCHC: 32.8 g/dL (ref 31.5–35.7)
MCV: 88 fL (ref 79–97)
MONOCYTES: 8 %
MONOS ABS: 0.6 10*3/uL (ref 0.1–0.9)
NEUTROS ABS: 4.3 10*3/uL (ref 1.4–7.0)
Neutrophils: 58 %
Platelets: 272 10*3/uL (ref 150–379)
RBC: 4.63 x10E6/uL (ref 3.77–5.28)
RDW: 13.8 % (ref 12.3–15.4)
WBC: 7.4 10*3/uL (ref 3.4–10.8)

## 2016-10-24 LAB — HIV ANTIBODY (ROUTINE TESTING W REFLEX): HIV SCREEN 4TH GENERATION: NONREACTIVE

## 2016-10-24 LAB — COMPREHENSIVE METABOLIC PANEL
ALBUMIN: 4.2 g/dL (ref 3.6–4.8)
ALK PHOS: 51 IU/L (ref 39–117)
ALT: 23 IU/L (ref 0–32)
AST: 22 IU/L (ref 0–40)
Albumin/Globulin Ratio: 1.6 (ref 1.2–2.2)
BILIRUBIN TOTAL: 0.4 mg/dL (ref 0.0–1.2)
BUN / CREAT RATIO: 22 (ref 12–28)
BUN: 14 mg/dL (ref 8–27)
CHLORIDE: 101 mmol/L (ref 96–106)
CO2: 26 mmol/L (ref 20–29)
Calcium: 9.9 mg/dL (ref 8.7–10.3)
Creatinine, Ser: 0.64 mg/dL (ref 0.57–1.00)
GFR calc Af Amer: 109 mL/min/{1.73_m2} (ref >59)
GFR calc non Af Amer: 95 mL/min/{1.73_m2} (ref >59)
GLUCOSE: 96 mg/dL (ref 65–99)
Globulin, Total: 2.6 g/dL (ref 1.5–4.5)
Potassium: 3.8 mmol/L (ref 3.5–5.2)
SODIUM: 139 mmol/L (ref 134–144)
Total Protein: 6.8 g/dL (ref 6.0–8.5)

## 2016-10-24 LAB — LIPID PANEL
Chol/HDL Ratio: 3.1 ratio (ref 0.0–4.4)
Cholesterol, Total: 202 mg/dL — ABNORMAL HIGH (ref 100–199)
HDL: 66 mg/dL (ref >39)
LDL Calculated: 112 mg/dL — ABNORMAL HIGH (ref 0–99)
Triglycerides: 121 mg/dL (ref 0–149)
VLDL Cholesterol Cal: 24 mg/dL (ref 5–40)

## 2016-10-24 LAB — TSH: TSH: 1.76 u[IU]/mL (ref 0.450–4.500)

## 2016-10-25 LAB — IGP, APTIMA HPV, RFX 16/18,45
HPV Aptima: NEGATIVE
PAP Smear Comment: 0

## 2017-01-18 ENCOUNTER — Telehealth: Payer: Self-pay

## 2017-01-18 NOTE — Telephone Encounter (Signed)
-----   Message from Leontine Locket, [place] sent at 05/19/2016  9:27 AM EDT ----- Regarding: MRI Schedule MRI in November.

## 2017-01-18 NOTE — Telephone Encounter (Signed)
Called pt to advise time for repeat MRI for:  - Only option I can see is to repeat MRI in 6-12 months to check for stability of the lesions.  Patient states she doesn't want to incur the cost of additional MRI.

## 2017-03-08 ENCOUNTER — Other Ambulatory Visit: Payer: Self-pay | Admitting: Family Medicine

## 2017-03-08 DIAGNOSIS — Z1231 Encounter for screening mammogram for malignant neoplasm of breast: Secondary | ICD-10-CM

## 2017-04-10 ENCOUNTER — Ambulatory Visit
Admission: RE | Admit: 2017-04-10 | Discharge: 2017-04-10 | Disposition: A | Discharge status: Home / Self Care | Payer: BLUE CROSS/BLUE SHIELD | Source: Ambulatory Visit | Attending: Family Medicine | Admitting: Family Medicine

## 2017-04-10 ENCOUNTER — Encounter: Payer: Self-pay | Admitting: Family Medicine

## 2017-04-10 DIAGNOSIS — Z1231 Encounter for screening mammogram for malignant neoplasm of breast: Secondary | ICD-10-CM | POA: Insufficient documentation

## 2017-04-24 ENCOUNTER — Ambulatory Visit: Payer: BLUE CROSS/BLUE SHIELD | Admitting: Family Medicine

## 2017-05-02 ENCOUNTER — Ambulatory Visit: Payer: BLUE CROSS/BLUE SHIELD | Admitting: Family Medicine

## 2017-05-09 ENCOUNTER — Ambulatory Visit
Admission: EM | Admit: 2017-05-09 | Discharge: 2017-05-09 | Disposition: A | Discharge status: Home / Self Care | Payer: BLUE CROSS/BLUE SHIELD | Attending: Family Medicine | Admitting: Family Medicine

## 2017-05-09 ENCOUNTER — Encounter: Payer: Self-pay | Admitting: *Deleted

## 2017-05-09 DIAGNOSIS — B9789 Other viral agents as the cause of diseases classified elsewhere: Secondary | ICD-10-CM | POA: Diagnosis not present

## 2017-05-09 DIAGNOSIS — J069 Acute upper respiratory infection, unspecified: Secondary | ICD-10-CM | POA: Diagnosis not present

## 2017-05-09 MED ORDER — FLUTICASONE PROPIONATE 50 MCG/ACT NA SUSP: 2.0000 | Freq: Every day | NASAL | 0 refills | Status: DC

## 2017-05-09 NOTE — Discharge Instructions (Signed)
Rest.  Flonase as prescribed.  Take care  Dr. Lacinda Axon

## 2017-05-09 NOTE — ED Provider Notes (Signed)
[facility]    CSN: [id number] Arrival date & time: 05/09/17  1111  History   Chief Complaint Chief Complaint  Patient presents with  . Cough  . Generalized Body Aches  . Weakness   HPI  67 year old female presents with concerns for influenza.  States that she has been sick since Sunday.  Has had congestion, sore throat, fatigue.  Associated body aches.  She has recently traveled.  No fever.  She is been using DayQuil and NyQuil without resolution.  She is concerned that she has influenza.  No known exacerbating factors.  No other associated symptoms.  No other complaints.  Past Medical History:  Diagnosis Date  . Bell's palsy   . DDD (degenerative disc disease), lumbar   . Fatigue   . Fibromyalgia   . Hemangioma of intra-abdominal structures   . History of Lyme disease     Patient Active Problem List   Diagnosis Date Noted  . 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 Acid reflux 10/07/2015  . Essential hypertension 02/10/2015  . Fibromyalgia 08/25/2014    Past Surgical History:  Procedure Laterality Date  . TUBAL LIGATION      OB History    No data available       Home Medications    Prior to Admission medications   Medication Sig Start Date End Date Taking? Authorizing Provider  famotidine (PEPCID) 10 MG tablet Take 10 mg by mouth daily as needed for heartburn or indigestion.   Yes [provider]  hydrochlorothiazide (HYDRODIURIL) 25 MG tablet Take 1 tablet (25 mg total) by mouth daily. 10/23/16  Yes Crissman, Jeannette How, MD  omeprazole (PRILOSEC) 20 MG capsule Take 20 mg by mouth daily as needed.    Yes [provider]  zolpidem (AMBIEN) 10 MG tablet Take 10 mg by mouth at bedtime as needed.    Yes [provider]  fluticasone (FLONASE) 50 MCG/ACT nasal spray Place 2 sprays into both nostrils daily. 05/09/17   Coral Spikes, DO    Family History Family History  Problem Relation Age of Onset  . Hypertension  Mother   . Osteoporosis Mother   . Cancer Mother        breast  . Breast cancer Mother        ~76  . Hypertension Father   . Heart attack Father   . Stroke Father   . Kidney disease Maternal Grandmother   . Osteoporosis Maternal Grandmother   . Kidney disease Maternal Grandfather   . Cancer Maternal Grandfather   . Kidney disease Paternal Grandmother   . Cancer Paternal Grandmother   . Kidney disease Paternal Grandfather   . Breast cancer Paternal Aunt        3 pat aunts    Social History Social History   Tobacco Use  . Smoking status: Never Smoker  . Smokeless tobacco: Never Used  Substance Use Topics  . Alcohol use: Yes    Alcohol/week: 0.0 oz    Comment: occassional  . Drug use: No     Allergies   Cymbalta [duloxetine hcl] and Latex   Review of Systems Review of Systems  Constitutional: Positive for fatigue. Negative for fever.  HENT: Positive for congestion and sore throat.   Musculoskeletal:       Body aches.   Physical Exam Triage Vital Signs ED Triage Vitals  Enc Vitals Group     BP 05/09/17 1138 (!) 163/71     Pulse Rate 05/09/17 1138  76     Resp 05/09/17 1138 16     Temp 05/09/17 1138 98 F (36.7 C)     Temp Source 05/09/17 1138 Oral     SpO2 05/09/17 1138 100 %     Weight 05/09/17 1141 208 lb (94.3 kg)     Height 05/09/17 1141 5\' 2"  (1.575 m)     Head Circumference --      Peak Flow --      Pain Score --      Pain Loc --      Pain Edu? --      Excl. in [place]? --    Updated Vital Signs BP (!) 163/71 (BP Location: Right Arm)   Pulse 76   Temp 98 F (36.7 C) (Oral)   Resp 16   Ht 5\' 2"  (1.575 m)   Wt 208 lb (94.3 kg)   SpO2 100%   BMI 38.04 kg/m  Physical Exam  Constitutional: She is oriented to person, place, and time. She appears well-developed. No distress.  HENT:  Head: Normocephalic and atraumatic.  Mouth/Throat: Oropharynx is clear and moist.  Normal TM's.  Eyes: Conjunctivae are normal. Right eye exhibits no discharge. Left  eye exhibits no discharge.  Neck: Neck supple.  Cardiovascular: Normal rate and regular rhythm.  Pulmonary/Chest: Effort normal and breath sounds normal.  Lymphadenopathy:    She has no cervical adenopathy.  Neurological: She is alert and oriented to person, place, and time.  Psychiatric: She has a normal mood and affect. Her behavior is normal.  Nursing note and vitals reviewed.  UC Treatments / Results  Labs (all labs ordered are listed, but only abnormal results are displayed) Labs Reviewed - No data to display  EKG  EKG Interpretation None       Radiology No results found.  Procedures Procedures (including critical care time)  Medications Ordered in UC Medications - No data to display   Initial Impression / Assessment and Plan / UC Course  I have reviewed the triage vital signs and the nursing notes.  Pertinent labs & imaging results that were available during my care of the patient were reviewed by me and considered in my medical decision making (see chart for details).     67 year old female presents with viral upper respiratory infection.  Treating with Flonase.  Supportive care.  Final Clinical Impressions(s) / UC Diagnoses   Final diagnoses:  Viral upper respiratory tract infection    ED Discharge Orders        Ordered    fluticasone (FLONASE) 50 MCG/ACT nasal spray  Daily     05/09/17 1211     Controlled Substance Prescriptions [place] Controlled Substance Registry consulted? Not Applicable   Coral Spikes, DO 05/09/17 1215

## 2017-05-09 NOTE — ED Triage Notes (Signed)
Non-productive cough, congestion, body aches, and weak since Saturday.

## 2017-05-31 ENCOUNTER — Other Ambulatory Visit: Payer: Self-pay | Admitting: Family Medicine

## 2017-06-25 ENCOUNTER — Ambulatory Visit: Payer: BLUE CROSS/BLUE SHIELD | Admitting: Family Medicine

## 2017-07-04 ENCOUNTER — Encounter: Payer: Self-pay | Admitting: Family Medicine

## 2017-07-04 ENCOUNTER — Ambulatory Visit: Payer: BLUE CROSS/BLUE SHIELD | Admitting: Family Medicine

## 2017-07-04 VITALS — BP 126/64 | HR 62 | Ht 62.0 in | Wt 212.0 lb

## 2017-07-04 DIAGNOSIS — M25561 Pain in right knee: Secondary | ICD-10-CM | POA: Diagnosis not present

## 2017-07-04 DIAGNOSIS — G8929 Other chronic pain: Secondary | ICD-10-CM

## 2017-07-04 DIAGNOSIS — I10 Essential (primary) hypertension: Secondary | ICD-10-CM | POA: Diagnosis not present

## 2017-07-04 DIAGNOSIS — M170 Bilateral primary osteoarthritis of knee: Secondary | ICD-10-CM | POA: Insufficient documentation

## 2017-07-04 DIAGNOSIS — M25562 Pain in left knee: Secondary | ICD-10-CM | POA: Diagnosis not present

## 2017-07-04 DIAGNOSIS — M25569 Pain in unspecified knee: Secondary | ICD-10-CM

## 2017-07-04 MED ORDER — MELOXICAM 15 MG PO TABS: 15.0000 mg | ORAL_TABLET | Freq: Every day | ORAL | 3 refills | Status: DC

## 2017-07-04 NOTE — Assessment & Plan Note (Signed)
Patient with knee pain mostly prepatellar knee pain.  Discussed care and treatment of bursitis use of meloxicam. May need referral to orthopedics if still bothersome.

## 2017-07-04 NOTE — Progress Notes (Signed)
BP 126/64   Pulse 62   Ht 5\' 2"  (1.575 m)   Wt 212 lb (96.2 kg)   SpO2 97%   BMI 38.78 kg/m    Subjective:    Patient ID: Jessica Mcgee, female    DOB: 1950/04/22, 67 y.o.   MRN: [id number]  HPI: Jessica Mcgee is a 67 y.o. female  Chief Complaint  Patient presents with  . Follow-up  . Hypertension  Patient follow-up hypertension doing well taking hydrochlorothiazide no issues doing well good blood pressure control. Fibromyalgia various aches pains have been stable. Patient really bothered by bilateral knee pain but left worse than right mostly in prepatellar area with marked pain going up and down steps or bending and stooping which she is unable to do because of the pain. Patient otherwise perseveres and keeps on moving.  Relevant past medical, surgical, family and social history reviewed and updated as indicated. Interim medical history since our last visit reviewed. Allergies and medications reviewed and updated.  Review of Systems  Constitutional: Negative.   Respiratory: Negative.   Cardiovascular: Negative.     Per HPI unless specifically indicated above     Objective:    BP 126/64   Pulse 62   Ht 5\' 2"  (1.575 m)   Wt 212 lb (96.2 kg)   SpO2 97%   BMI 38.78 kg/m   Wt Readings from Last 3 Encounters:  07/04/17 212 lb (96.2 kg)  05/09/17 208 lb (94.3 kg)  10/23/16 208 lb 11.2 oz (94.7 kg)    Physical Exam  Constitutional: She is oriented to person, place, and time. She appears well-developed and well-nourished.  HENT:  Head: Normocephalic and atraumatic.  Eyes: Conjunctivae and EOM are normal.  Neck: Normal range of motion.  Cardiovascular: Normal rate, regular rhythm and normal heart sounds.  Pulmonary/Chest: Effort normal and breath sounds normal.  Musculoskeletal: Normal range of motion.  Neurological: She is alert and oriented to person, place, and time.  Skin: No erythema.  Psychiatric: She has a normal mood and affect. Her behavior is normal.  Judgment and thought content normal.    Results for orders placed or performed in visit on 10/23/16  Microscopic Examination  Result Value Ref Range   WBC, UA 0-5 0 - 5 /hpf   RBC, UA 0-2 0 - 2 /hpf   Epithelial Cells (non renal) 0-10 0 - 10 /hpf   Bacteria, UA None seen None seen/Few  HIV antibody  Result Value Ref Range   HIV Screen 4th Generation wRfx Non Reactive Non Reactive  CBC with Differential/Platelet  Result Value Ref Range   WBC 7.4 3.4 - 10.8 x10E3/uL   RBC 4.63 3.77 - 5.28 x10E6/uL   Hemoglobin 13.4 11.1 - 15.9 g/dL   Hematocrit 40.8 34.0 - 46.6 %   MCV 88 79 - 97 fL   MCH 28.9 26.6 - 33.0 pg   MCHC 32.8 31.5 - 35.7 g/dL   RDW 13.8 12.3 - 15.4 %   Platelets 272 150 - 379 x10E3/uL   Neutrophils 58 Not Estab. %   Lymphs 33 Not Estab. %   Monocytes 8 Not Estab. %   Eos 1 Not Estab. %   Basos 0 Not Estab. %   Neutrophils Absolute 4.3 1.4 - 7.0 x10E3/uL   Lymphocytes Absolute 2.5 0.7 - 3.1 x10E3/uL   Monocytes Absolute 0.6 0.1 - 0.9 x10E3/uL   EOS (ABSOLUTE) 0.1 0.0 - 0.4 x10E3/uL   Basophils Absolute 0.0 0.0 - 0.2 x10E3/uL  Immature Granulocytes 0 Not Estab. %   Immature Grans (Abs) 0.0 0.0 - 0.1 x10E3/uL  Comprehensive metabolic panel  Result Value Ref Range   Glucose 96 65 - 99 mg/dL   BUN 14 8 - 27 mg/dL   Creatinine, Ser 0.64 0.57 - 1.00 mg/dL   GFR calc non Af Amer 95 >59 mL/min/1.73   GFR calc Af Amer 109 >59 mL/min/1.73   BUN/Creatinine Ratio 22 12 - 28   Sodium 139 134 - 144 mmol/L   Potassium 3.8 3.5 - 5.2 mmol/L   Chloride 101 96 - 106 mmol/L   CO2 26 20 - 29 mmol/L   Calcium 9.9 8.7 - 10.3 mg/dL   Total Protein 6.8 6.0 - 8.5 g/dL   Albumin 4.2 3.6 - 4.8 g/dL   Globulin, Total 2.6 1.5 - 4.5 g/dL   Albumin/Globulin Ratio 1.6 1.2 - 2.2   Bilirubin Total 0.4 0.0 - 1.2 mg/dL   Alkaline Phosphatase 51 39 - 117 IU/L   AST 22 0 - 40 IU/L   ALT 23 0 - 32 IU/L  Lipid panel  Result Value Ref Range   Cholesterol, Total 202 (H) 100 - 199 mg/dL    Triglycerides 121 0 - 149 mg/dL   HDL 66 >39 mg/dL   VLDL Cholesterol Cal 24 5 - 40 mg/dL   LDL Calculated 112 (H) 0 - 99 mg/dL   Chol/HDL Ratio 3.1 0.0 - 4.4 ratio  TSH  Result Value Ref Range   TSH 1.760 0.450 - 4.500 uIU/mL  Urinalysis, Routine w reflex microscopic  Result Value Ref Range   Specific Gravity, UA 1.010 1.005 - 1.030   pH, UA 5.5 5.0 - 7.5   Color, UA Yellow Yellow   Appearance Ur Clear Clear   Leukocytes, UA Negative Negative   Protein, UA Negative Negative/Trace   Glucose, UA Negative Negative   Ketones, UA Negative Negative   RBC, UA Trace (A) Negative   Bilirubin, UA Negative Negative   Urobilinogen, Ur 0.2 0.2 - 1.0 mg/dL   Nitrite, UA Negative Negative   Microscopic Examination See below:   IGP, Aptima HPV, rfx 16/18,45  Result Value Ref Range   DIAGNOSIS: Comment    Specimen adequacy: Comment    Clinician Provided ICD10 Comment    Performed by: Comment    PAP Smear Comment .    Note: Comment    Test Methodology Comment    HPV Aptima Negative Negative      Assessment & Plan:   Problem List Items Addressed This Visit      Cardiovascular and Mediastinum   Essential hypertension - Primary    The current medical regimen is effective;  continue present plan and medications.       Relevant Orders   Basic metabolic panel     Other   Knee pain, chronic    Patient with knee pain mostly prepatellar knee pain.  Discussed care and treatment of bursitis use of meloxicam. May need referral to orthopedics if still bothersome.      Relevant Medications   meloxicam (MOBIC) 15 MG tablet       Follow up plan: Return for Physical Exam August.

## 2017-07-04 NOTE — Assessment & Plan Note (Signed)
The current medical regimen is effective;  continue present plan and medications.  

## 2017-07-05 ENCOUNTER — Telehealth: Payer: Self-pay | Admitting: Family Medicine

## 2017-07-05 LAB — BASIC METABOLIC PANEL
BUN/Creatinine Ratio: 14 (ref 12–28)
BUN: 10 mg/dL (ref 8–27)
CALCIUM: 9.6 mg/dL (ref 8.7–10.3)
CO2: 26 mmol/L (ref 20–29)
CREATININE: 0.7 mg/dL (ref 0.57–1.00)
Chloride: 97 mmol/L (ref 96–106)
GFR calc Af Amer: 106 mL/min/{1.73_m2} (ref >59)
GFR calc non Af Amer: 92 mL/min/{1.73_m2} (ref >59)
GLUCOSE: 110 mg/dL — AB (ref 65–99)
Potassium: 3.4 mmol/L — ABNORMAL LOW (ref 3.5–5.2)
Sodium: 136 mmol/L (ref 134–144)

## 2017-07-05 NOTE — Telephone Encounter (Signed)
-----   Message from Amada Kingfisher, [place] sent at 07/05/2017 12:10 PM EDT ----- Patient was transferred to provider for telephone conversation.

## 2017-07-05 NOTE — Telephone Encounter (Signed)
Phone call Discussed with patient potassium of 3.4 patient taking hydrochlorothiazide discussed potassium.  Patient will do potassium supplementation with dietary means oranges apples banana etc.

## 2017-08-21 ENCOUNTER — Telehealth: Payer: Self-pay | Admitting: Family Medicine

## 2017-08-21 NOTE — Telephone Encounter (Signed)
Copied from [place] [id number]. Topic: Quick Communication - See Telephone Encounter >> Aug 21, 2017  2:40 PM Cleaster Corin, NT wrote: CRM for notification. See Telephone encounter for: 08/21/17.  Pt. Calling to see if she can get a Dr. Note stating that she can not sit through jury duty due to her medical issues. Madaline Savage duty is supposed to be held on Tuesday July 9th pt. Can be reached at [phone]

## 2017-08-22 NOTE — Telephone Encounter (Signed)
Left message on machine for pt to return call to the office.  

## 2017-08-22 NOTE — Telephone Encounter (Signed)
Call pt Letter ready to pick up

## 2017-08-28 ENCOUNTER — Encounter: Payer: Self-pay | Admitting: Family Medicine

## 2017-10-22 ENCOUNTER — Other Ambulatory Visit: Payer: Self-pay | Admitting: Family Medicine

## 2017-10-22 NOTE — Telephone Encounter (Signed)
Meloxicam refill Last Refill:07/04/17 # 30/3 refill Last OV: 07/04/17 PCP: Jeananne Rama Pharmacy: [id number] - [place], [place] [phone] (Phone) [phone] (Fax)

## 2017-10-31 ENCOUNTER — Encounter: Payer: BLUE CROSS/BLUE SHIELD | Admitting: Family Medicine

## 2017-12-14 ENCOUNTER — Other Ambulatory Visit: Payer: Self-pay | Admitting: Family Medicine

## 2017-12-14 DIAGNOSIS — I10 Essential (primary) hypertension: Secondary | ICD-10-CM

## 2017-12-14 NOTE — Telephone Encounter (Signed)
Requested Prescriptions  Pending Prescriptions Disp Refills  . hydrochlorothiazide (HYDRODIURIL) 25 MG tablet [Pharmacy Med Name: HYDROCHLOROTHIAZIDE 25 MG TAB] 90 tablet 0    Sig: TAKE 1 TABLET BY MOUTH EVERY DAY     Cardiovascular: Diuretics - Thiazide Failed - 12/14/2017  1:59 AM      Failed - K in normal range and within 360 days    Potassium  Date Value Ref Range Status  07/04/2017 3.4 (L) 3.5 - 5.2 mmol/L Final         Passed - Ca in normal range and within 360 days    Calcium  Date Value Ref Range Status  07/04/2017 9.6 8.7 - 10.3 mg/dL Final         Passed - Cr in normal range and within 360 days    Creatinine, Ser  Date Value Ref Range Status  07/04/2017 0.70 0.57 - 1.00 mg/dL Final         Passed - Na in normal range and within 360 days    Sodium  Date Value Ref Range Status  07/04/2017 136 134 - 144 mmol/L Final         Passed - Last BP in normal range    BP Readings from Last 1 Encounters:  07/04/17 126/64         Passed - Valid encounter within last 6 months    Recent Outpatient Visits          5 months ago Essential hypertension   [facility] Guadalupe Maple, MD   1 year ago Essential hypertension   [place], Jeannette How, MD   1 year ago Essential hypertension   [place], Jeannette How, MD   2 years ago Annual physical exam   [facility] Crissman, Jeannette How, MD   2 years ago Breast cancer screening   [place], Jeannette How, MD      Future Appointments            In 1 month Crissman, Jeannette How, MD [facility]

## 2018-01-28 ENCOUNTER — Encounter: Payer: Self-pay | Admitting: Family Medicine

## 2018-01-28 ENCOUNTER — Ambulatory Visit (INDEPENDENT_AMBULATORY_CARE_PROVIDER_SITE_OTHER): Payer: PPO | Admitting: Family Medicine

## 2018-01-28 ENCOUNTER — Other Ambulatory Visit (HOSPITAL_COMMUNITY)
Admission: RE | Admit: 2018-01-28 | Discharge: 2018-01-28 | Disposition: A | Discharge status: Home / Self Care | Payer: PPO | Source: Ambulatory Visit | Attending: Family Medicine | Admitting: Family Medicine

## 2018-01-28 VITALS — BP 138/68 | HR 84 | Ht 61.02 in | Wt 213.0 lb

## 2018-01-28 DIAGNOSIS — M25561 Pain in right knee: Secondary | ICD-10-CM | POA: Diagnosis not present

## 2018-01-28 DIAGNOSIS — Z131 Encounter for screening for diabetes mellitus: Secondary | ICD-10-CM

## 2018-01-28 DIAGNOSIS — Z0000 Encounter for general adult medical examination without abnormal findings: Secondary | ICD-10-CM

## 2018-01-28 DIAGNOSIS — Z1329 Encounter for screening for other suspected endocrine disorder: Secondary | ICD-10-CM

## 2018-01-28 DIAGNOSIS — G8929 Other chronic pain: Secondary | ICD-10-CM | POA: Diagnosis not present

## 2018-01-28 DIAGNOSIS — M25562 Pain in left knee: Secondary | ICD-10-CM | POA: Diagnosis not present

## 2018-01-28 DIAGNOSIS — Z13220 Encounter for screening for lipoid disorders: Secondary | ICD-10-CM | POA: Diagnosis not present

## 2018-01-28 DIAGNOSIS — Z124 Encounter for screening for malignant neoplasm of cervix: Secondary | ICD-10-CM | POA: Diagnosis not present

## 2018-01-28 DIAGNOSIS — Z8742 Personal history of other diseases of the female genital tract: Secondary | ICD-10-CM

## 2018-01-28 DIAGNOSIS — Z780 Asymptomatic menopausal state: Secondary | ICD-10-CM

## 2018-01-28 DIAGNOSIS — I10 Essential (primary) hypertension: Secondary | ICD-10-CM | POA: Diagnosis not present

## 2018-01-28 LAB — URINALYSIS, ROUTINE W REFLEX MICROSCOPIC
Bilirubin, UA: NEGATIVE
GLUCOSE, UA: NEGATIVE
Ketones, UA: NEGATIVE
Nitrite, UA: NEGATIVE
Protein, UA: NEGATIVE
SPEC GRAV UA: 1.02 (ref 1.005–1.030)
UUROB: 0.2 mg/dL (ref 0.2–1.0)
pH, UA: 5.5 (ref 5.0–7.5)

## 2018-01-28 LAB — MICROSCOPIC EXAMINATION: Bacteria, UA: NONE SEEN

## 2018-01-28 MED ORDER — HYDROCHLOROTHIAZIDE 25 MG PO TABS: 25.0000 mg | ORAL_TABLET | Freq: Every day | ORAL | 4 refills | Status: DC

## 2018-01-28 MED ORDER — ZOLPIDEM TARTRATE 10 MG PO TABS: 10.0000 mg | ORAL_TABLET | Freq: Every evening | ORAL | 0 refills | Status: DC | PRN

## 2018-01-28 NOTE — Assessment & Plan Note (Addendum)
The current medical regimen is effective;  continue present plan and medications.  

## 2018-01-28 NOTE — Assessment & Plan Note (Signed)
Orthopedic referral.

## 2018-01-28 NOTE — Progress Notes (Signed)
BP 138/68   Pulse 84   Ht 5' 1.02" (1.55 m)   Wt 213 lb (96.6 kg)   SpO2 98%   BMI 40.21 kg/m    Subjective:    Patient ID: Jessica Mcgee, female    DOB: [birth year], 67 y.o.   MRN: [id number]  HPI: Jessica Mcgee is a 67 y.o. female  Chief Complaint  Patient presents with  . Annual Exam  . Knee Pain  Patient's primary concern today involves knee pain.  Patient's been taking meloxicam and other nonsteroidals to control pain but has needed to stop due to hand swelling.  Stopping the meloxicam helped the hand swelling but knees are worse to the point where she can barely walk up and down steps.  Standing is bothersome the most along with step climbing walking or sitting is okay. Patient's for other problems tried physical therapy and is very reluctant to do so again due to issues with physical therapist and hurting her.  patient indicates she has problems sleeping but is related to her knees.     Relevant past medical, surgical, family and social history reviewed and updated as indicated. Interim medical history since our last visit reviewed. Allergies and medications reviewed and updated.  Review of Systems  Constitutional: Negative.   HENT: Negative.   Eyes: Negative.   Respiratory: Negative.   Cardiovascular: Negative.   Gastrointestinal: Negative.   Endocrine: Negative.   Genitourinary: Negative.   Musculoskeletal: Negative.        Patient's had some residual fibromyalgia type pain in her feet which is not as prominent now that her knees hurt more.    Skin: Negative.   Allergic/Immunologic: Negative.   Neurological: Negative.   Hematological: Negative.   Psychiatric/Behavioral: Negative.     Per HPI unless specifically indicated above     Objective:    BP 138/68   Pulse 84   Ht 5' 1.02" (1.55 m)   Wt 213 lb (96.6 kg)   SpO2 98%   BMI 40.21 kg/m   Wt Readings from Last 3 Encounters:  01/28/18 213 lb (96.6 kg)  07/04/17 212 lb (96.2 kg)  05/09/17 208 lb  (94.3 kg)    Physical Exam  Constitutional: She is oriented to person, place, and time. She appears well-developed and well-nourished.  HENT:  Head: Normocephalic and atraumatic.  Right Ear: External ear normal.  Left Ear: External ear normal.  Nose: Nose normal.  Mouth/Throat: Oropharynx is clear and moist.  Eyes: Pupils are equal, round, and reactive to light. Conjunctivae and EOM are normal.  Neck: Normal range of motion. Neck supple. Carotid bruit is not present.  Cardiovascular: Normal rate, regular rhythm and normal heart sounds.  No murmur heard. Pulmonary/Chest: Effort normal and breath sounds normal. She exhibits no mass. Right breast exhibits no mass, no skin change and no tenderness. Left breast exhibits no mass, no skin change and no tenderness. Breasts are symmetrical.  Abdominal: Soft. Bowel sounds are normal. There is no hepatosplenomegaly.  Genitourinary: Vagina normal and uterus normal. Right adnexum displays no mass and no tenderness. Left adnexum displays no mass and no tenderness.  Musculoskeletal: Normal range of motion.  Neurological: She is alert and oriented to person, place, and time.  Skin: No rash noted.  Psychiatric: She has a normal mood and affect. Her behavior is normal. Judgment and thought content normal.    Results for orders placed or performed in visit on 96/29/52  Basic metabolic panel  Result Value Ref  Range   Glucose 110 (H) 65 - 99 mg/dL   BUN 10 8 - 27 mg/dL   Creatinine, Ser 0.70 0.57 - 1.00 mg/dL   GFR calc non Af Amer 92 >59 mL/min/1.73   GFR calc Af Amer 106 >59 mL/min/1.73   BUN/Creatinine Ratio 14 12 - 28   Sodium 136 134 - 144 mmol/L   Potassium 3.4 (L) 3.5 - 5.2 mmol/L   Chloride 97 96 - 106 mmol/L   CO2 26 20 - 29 mmol/L   Calcium 9.6 8.7 - 10.3 mg/dL      Assessment & Plan:   Problem List Items Addressed This Visit      Cardiovascular and Mediastinum   Essential hypertension - Primary    Diet controled      Relevant  Orders   CBC with Differential/Platelet   Comprehensive metabolic panel   Lipid panel   Urinalysis, Routine w reflex microscopic     Other   Knee pain, chronic    Orthopedic referral       Other Visit Diagnoses    Screening for diabetes mellitus (DM)       Relevant Orders   CBC with Differential/Platelet   Comprehensive metabolic panel   Lipid panel   Urinalysis, Routine w reflex microscopic   Annual physical exam       Relevant Orders   CBC with Differential/Platelet   Comprehensive metabolic panel   Lipid panel   TSH   Urinalysis, Routine w reflex microscopic   Screening cholesterol level       Relevant Orders   CBC with Differential/Platelet   Comprehensive metabolic panel   Lipid panel   Urinalysis, Routine w reflex microscopic   Thyroid disorder screen       Relevant Orders   TSH   Hx of abnormal cervical Pap smear       Relevant Orders   Cytology - PAP   Cervical cancer screening       Relevant Orders   Cytology - PAP   Postmenopausal estrogen deficiency       Relevant Orders   DG Bone Density       Follow up plan: Return if symptoms worsen or fail to improve.

## 2018-01-29 LAB — COMPREHENSIVE METABOLIC PANEL
ALBUMIN: 4.2 g/dL (ref 3.6–4.8)
ALT: 28 IU/L (ref 0–32)
AST: 22 IU/L (ref 0–40)
Albumin/Globulin Ratio: 2 (ref 1.2–2.2)
Alkaline Phosphatase: 46 IU/L (ref 39–117)
BUN/Creatinine Ratio: 29 — ABNORMAL HIGH (ref 12–28)
BUN: 20 mg/dL (ref 8–27)
Bilirubin Total: 0.3 mg/dL (ref 0.0–1.2)
CO2: 25 mmol/L (ref 20–29)
CREATININE: 0.68 mg/dL (ref 0.57–1.00)
Calcium: 9.9 mg/dL (ref 8.7–10.3)
Chloride: 99 mmol/L (ref 96–106)
GFR calc non Af Amer: 92 mL/min/{1.73_m2} (ref >59)
GFR, EST AFRICAN AMERICAN: 106 mL/min/{1.73_m2} (ref >59)
GLOBULIN, TOTAL: 2.1 g/dL (ref 1.5–4.5)
GLUCOSE: 112 mg/dL — AB (ref 65–99)
Potassium: 3 mmol/L — ABNORMAL LOW (ref 3.5–5.2)
SODIUM: 139 mmol/L (ref 134–144)
TOTAL PROTEIN: 6.3 g/dL (ref 6.0–8.5)

## 2018-01-29 LAB — CBC WITH DIFFERENTIAL/PLATELET
BASOS: 0 %
Basophils Absolute: 0 10*3/uL (ref 0.0–0.2)
EOS (ABSOLUTE): 0.1 10*3/uL (ref 0.0–0.4)
EOS: 1 %
HEMATOCRIT: 38.7 % (ref 34.0–46.6)
HEMOGLOBIN: 13.1 g/dL (ref 11.1–15.9)
IMMATURE GRANS (ABS): 0 10*3/uL (ref 0.0–0.1)
Immature Granulocytes: 0 %
LYMPHS ABS: 3.5 10*3/uL — AB (ref 0.7–3.1)
LYMPHS: 36 %
MCH: 29.5 pg (ref 26.6–33.0)
MCHC: 33.9 g/dL (ref 31.5–35.7)
MCV: 87 fL (ref 79–97)
MONOCYTES: 7 %
Monocytes Absolute: 0.7 10*3/uL (ref 0.1–0.9)
Neutrophils Absolute: 5.5 10*3/uL (ref 1.4–7.0)
Neutrophils: 56 %
Platelets: 274 10*3/uL (ref 150–450)
RBC: 4.44 x10E6/uL (ref 3.77–5.28)
RDW: 12.6 % (ref 12.3–15.4)
WBC: 9.8 10*3/uL (ref 3.4–10.8)

## 2018-01-29 LAB — LIPID PANEL
Chol/HDL Ratio: 3.2 ratio (ref 0.0–4.4)
Cholesterol, Total: 215 mg/dL — ABNORMAL HIGH (ref 100–199)
HDL: 68 mg/dL (ref >39)
LDL Calculated: 127 mg/dL — ABNORMAL HIGH (ref 0–99)
Triglycerides: 102 mg/dL (ref 0–149)
VLDL Cholesterol Cal: 20 mg/dL (ref 5–40)

## 2018-01-29 LAB — TSH: TSH: 2.73 u[IU]/mL (ref 0.450–4.500)

## 2018-01-30 ENCOUNTER — Other Ambulatory Visit: Payer: Self-pay | Admitting: Family Medicine

## 2018-01-30 ENCOUNTER — Encounter: Payer: Self-pay | Admitting: Family Medicine

## 2018-01-30 DIAGNOSIS — E876 Hypokalemia: Secondary | ICD-10-CM

## 2018-01-30 LAB — CYTOLOGY - PAP: DIAGNOSIS: NEGATIVE

## 2018-01-30 MED ORDER — POTASSIUM CHLORIDE ER 10 MEQ PO TBCR: 10.0000 meq | EXTENDED_RELEASE_TABLET | Freq: Every day | ORAL | 4 refills | Status: DC

## 2018-01-30 NOTE — Progress Notes (Signed)
Phone call Discussed with patient low potassium which is been a problem on hydrochlorothiazide.  Patient wants to continue her hydrochlorothiazide so will add potassium 10 mEq and recheck BMP 1 month or so.

## 2018-02-04 DIAGNOSIS — M170 Bilateral primary osteoarthritis of knee: Secondary | ICD-10-CM | POA: Diagnosis not present

## 2018-03-01 ENCOUNTER — Ambulatory Visit (INDEPENDENT_AMBULATORY_CARE_PROVIDER_SITE_OTHER): Payer: PPO | Admitting: Family Medicine

## 2018-03-01 ENCOUNTER — Ambulatory Visit
Admission: RE | Admit: 2018-03-01 | Discharge: 2018-03-01 | Disposition: A | Discharge status: Home / Self Care | Payer: PPO | Attending: Family Medicine | Admitting: Family Medicine

## 2018-03-01 ENCOUNTER — Ambulatory Visit
Admission: RE | Admit: 2018-03-01 | Discharge: 2018-03-01 | Disposition: A | Discharge status: Home / Self Care | Payer: PPO | Source: Ambulatory Visit | Attending: Family Medicine | Admitting: Family Medicine

## 2018-03-01 ENCOUNTER — Other Ambulatory Visit: Payer: PPO

## 2018-03-01 ENCOUNTER — Encounter: Payer: Self-pay | Admitting: Family Medicine

## 2018-03-01 VITALS — BP 128/84 | HR 80 | Temp 98.4°F | Ht 61.0 in | Wt 210.2 lb

## 2018-03-01 DIAGNOSIS — M25512 Pain in left shoulder: Secondary | ICD-10-CM

## 2018-03-01 DIAGNOSIS — S4992XA Unspecified injury of left shoulder and upper arm, initial encounter: Secondary | ICD-10-CM | POA: Diagnosis not present

## 2018-03-01 DIAGNOSIS — E876 Hypokalemia: Secondary | ICD-10-CM

## 2018-03-01 MED ORDER — TRAMADOL HCL 50 MG PO TABS: 50.0000 mg | ORAL_TABLET | Freq: Three times a day (TID) | ORAL | 0 refills | Status: DC | PRN

## 2018-03-01 MED ORDER — CYCLOBENZAPRINE HCL 10 MG PO TABS: 10.0000 mg | ORAL_TABLET | Freq: Three times a day (TID) | ORAL | 0 refills | Status: DC | PRN

## 2018-03-01 MED ORDER — TRIAMCINOLONE ACETONIDE 40 MG/ML IJ SUSP
40.0000 mg | Freq: Once | INTRAMUSCULAR | Status: AC
  Administered 2018-03-01: 40 mg via INTRAMUSCULAR

## 2018-03-01 NOTE — Progress Notes (Signed)
BP 128/84   Pulse 80   Temp 98.4 F (36.9 C) (Oral)   Ht 5\' 1"  (1.549 m)   Wt 210 lb 3.2 oz (95.3 kg)   SpO2 97%   BMI 39.72 kg/m    Subjective:    Patient ID: Jessica Mcgee, female    DOB: December 24, 1950, 67 y.o.   MRN: [id number]  HPI: Jessica Mcgee is a 67 y.o. female  Chief Complaint  Patient presents with  . Shoulder Pain    pt states she injured her left shoulder while trying to load a horse on to a trailer, states the horse spooked. States this happened about 9 days ago.    Left shoulder pain x 9 days. Has limited ROM and initially had some swelling. Not improving at all. Seems to help some if she keeps it moving. Tried some tylenol which didn't really help. Also tried some meloxicam but that causes her feet to swell and didn't help with the pain. Denies numbness or tingling in UEs, radiation down arms, weakness, swelling down left arm. No hx of shoulder issues.   Relevant past medical, surgical, family and social history reviewed and updated as indicated. Interim medical history since our last visit reviewed. Allergies and medications reviewed and updated.  Review of Systems  Per HPI unless specifically indicated above     Objective:    BP 128/84   Pulse 80   Temp 98.4 F (36.9 C) (Oral)   Ht 5\' 1"  (1.549 m)   Wt 210 lb 3.2 oz (95.3 kg)   SpO2 97%   BMI 39.72 kg/m   Wt Readings from Last 3 Encounters:  03/01/18 210 lb 3.2 oz (95.3 kg)  01/28/18 213 lb (96.6 kg)  07/04/17 212 lb (96.2 kg)    Physical Exam Vitals signs and nursing note reviewed.  Constitutional:      Appearance: Normal appearance. She is not ill-appearing.  HENT:     Head: Atraumatic.  Eyes:     Extraocular Movements: Extraocular movements intact.     Conjunctiva/sclera: Conjunctivae normal.  Neck:     Musculoskeletal: Normal range of motion and neck supple.  Cardiovascular:     Rate and Rhythm: Normal rate and regular rhythm.     Pulses: Normal pulses.     Heart sounds: Normal  heart sounds.  Pulmonary:     Effort: Pulmonary effort is normal.     Breath sounds: Normal breath sounds.  Musculoskeletal:        General: Signs of injury present. No swelling or deformity.     Comments: Strength full and equal b/l UEs ROM decreased left shoulder with extension past 30 degrees, unable to reach behind back  Skin:    General: Skin is warm and dry.  Neurological:     Mental Status: She is alert and oriented to person, place, and time.     Sensory: No sensory deficit.     Motor: No weakness.  Psychiatric:        Mood and Affect: Mood normal.        Thought Content: Thought content normal.        Judgment: Judgment normal.     Results for orders placed or performed in visit on 01/28/18  Microscopic Examination  Result Value Ref Range   WBC, UA 0-5 0 - 5 /hpf   RBC, UA 0-2 0 - 2 /hpf   Epithelial Cells (non renal) 0-10 0 - 10 /hpf   Renal Epithel, UA 0-10 (  A) None seen /hpf   Bacteria, UA None seen None seen/Few  CBC with Differential/Platelet  Result Value Ref Range   WBC 9.8 3.4 - 10.8 x10E3/uL   RBC 4.44 3.77 - 5.28 x10E6/uL   Hemoglobin 13.1 11.1 - 15.9 g/dL   Hematocrit 38.7 34.0 - 46.6 %   MCV 87 79 - 97 fL   MCH 29.5 26.6 - 33.0 pg   MCHC 33.9 31.5 - 35.7 g/dL   RDW 12.6 12.3 - 15.4 %   Platelets 274 150 - 450 x10E3/uL   Neutrophils 56 Not Estab. %   Lymphs 36 Not Estab. %   Monocytes 7 Not Estab. %   Eos 1 Not Estab. %   Basos 0 Not Estab. %   Neutrophils Absolute 5.5 1.4 - 7.0 x10E3/uL   Lymphocytes Absolute 3.5 (H) 0.7 - 3.1 x10E3/uL   Monocytes Absolute 0.7 0.1 - 0.9 x10E3/uL   EOS (ABSOLUTE) 0.1 0.0 - 0.4 x10E3/uL   Basophils Absolute 0.0 0.0 - 0.2 x10E3/uL   Immature Granulocytes 0 Not Estab. %   Immature Grans (Abs) 0.0 0.0 - 0.1 x10E3/uL  Comprehensive metabolic panel  Result Value Ref Range   Glucose 112 (H) 65 - 99 mg/dL   BUN 20 8 - 27 mg/dL   Creatinine, Ser 0.68 0.57 - 1.00 mg/dL   GFR calc non Af Amer 92 >59 mL/min/1.73   GFR  calc Af Amer 106 >59 mL/min/1.73   BUN/Creatinine Ratio 29 (H) 12 - 28   Sodium 139 134 - 144 mmol/L   Potassium 3.0 (L) 3.5 - 5.2 mmol/L   Chloride 99 96 - 106 mmol/L   CO2 25 20 - 29 mmol/L   Calcium 9.9 8.7 - 10.3 mg/dL   Total Protein 6.3 6.0 - 8.5 g/dL   Albumin 4.2 3.6 - 4.8 g/dL   Globulin, Total 2.1 1.5 - 4.5 g/dL   Albumin/Globulin Ratio 2.0 1.2 - 2.2   Bilirubin Total 0.3 0.0 - 1.2 mg/dL   Alkaline Phosphatase 46 39 - 117 IU/L   AST 22 0 - 40 IU/L   ALT 28 0 - 32 IU/L  Lipid panel  Result Value Ref Range   Cholesterol, Total 215 (H) 100 - 199 mg/dL   Triglycerides 102 0 - 149 mg/dL   HDL 68 >39 mg/dL   VLDL Cholesterol Cal 20 5 - 40 mg/dL   LDL Calculated 127 (H) 0 - 99 mg/dL   Chol/HDL Ratio 3.2 0.0 - 4.4 ratio  TSH  Result Value Ref Range   TSH 2.730 0.450 - 4.500 uIU/mL  Urinalysis, Routine w reflex microscopic  Result Value Ref Range   Specific Gravity, UA 1.020 1.005 - 1.030   pH, UA 5.5 5.0 - 7.5   Color, UA Yellow Yellow   Appearance Ur Hazy (A) Clear   Leukocytes, UA Trace (A) Negative   Protein, UA Negative Negative/Trace   Glucose, UA Negative Negative   Ketones, UA Negative Negative   RBC, UA 2+ (A) Negative   Bilirubin, UA Negative Negative   Urobilinogen, Ur 0.2 0.2 - 1.0 mg/dL   Nitrite, UA Negative Negative   Microscopic Examination See below:   Cytology - PAP  Result Value Ref Range   Adequacy      Satisfactory for evaluation. The presence or absence of an endocervical / transformation zone component cannot be determined because of atrophy.   Diagnosis      NEGATIVE FOR INTRAEPITHELIAL LESIONS OR MALIGNANCY.   Material Submitted CervicoVaginal Pap [ThinPrep  Imaged]       Assessment & Plan:   Problem List Items Addressed This Visit    None    Visit Diagnoses    Acute pain of left shoulder    -  Primary   Await shoulder x-ray. IM kenalog given today, flexeril and tramadol sent for prn use. Sedation and addiction precautions reviewed    Relevant Medications   triamcinolone acetonide (KENALOG-40) injection 40 mg (Completed)   Other Relevant Orders   DG Shoulder Left (Completed)       Follow up plan: Return if symptoms worsen or fail to improve.

## 2018-03-02 ENCOUNTER — Encounter: Payer: Self-pay | Admitting: Family Medicine

## 2018-03-02 LAB — BASIC METABOLIC PANEL
BUN/Creatinine Ratio: 21 (ref 12–28)
BUN: 13 mg/dL (ref 8–27)
CHLORIDE: 93 mmol/L — AB (ref 96–106)
CO2: 25 mmol/L (ref 20–29)
Calcium: 9.6 mg/dL (ref 8.7–10.3)
Creatinine, Ser: 0.62 mg/dL (ref 0.57–1.00)
GFR calc non Af Amer: 95 mL/min/{1.73_m2} (ref >59)
GFR, EST AFRICAN AMERICAN: 109 mL/min/{1.73_m2} (ref >59)
Glucose: 90 mg/dL (ref 65–99)
POTASSIUM: 3.6 mmol/L (ref 3.5–5.2)
Sodium: 139 mmol/L (ref 134–144)

## 2018-05-08 DIAGNOSIS — M170 Bilateral primary osteoarthritis of knee: Secondary | ICD-10-CM | POA: Diagnosis not present

## 2018-05-09 DIAGNOSIS — M170 Bilateral primary osteoarthritis of knee: Secondary | ICD-10-CM | POA: Diagnosis not present

## 2018-05-15 DIAGNOSIS — M170 Bilateral primary osteoarthritis of knee: Secondary | ICD-10-CM | POA: Diagnosis not present

## 2018-05-21 ENCOUNTER — Inpatient Hospital Stay: Admission: RE | Admit: 2018-05-21 | Payer: PPO | Source: Ambulatory Visit

## 2018-05-21 DIAGNOSIS — M170 Bilateral primary osteoarthritis of knee: Secondary | ICD-10-CM | POA: Diagnosis not present

## 2018-05-22 DIAGNOSIS — M25569 Pain in unspecified knee: Secondary | ICD-10-CM | POA: Diagnosis not present

## 2018-05-22 DIAGNOSIS — G894 Chronic pain syndrome: Secondary | ICD-10-CM | POA: Diagnosis not present

## 2018-05-24 DIAGNOSIS — M25562 Pain in left knee: Secondary | ICD-10-CM | POA: Diagnosis not present

## 2018-05-24 DIAGNOSIS — M25561 Pain in right knee: Secondary | ICD-10-CM | POA: Diagnosis not present

## 2018-05-28 DIAGNOSIS — M1711 Unilateral primary osteoarthritis, right knee: Secondary | ICD-10-CM | POA: Diagnosis not present

## 2018-05-28 DIAGNOSIS — M170 Bilateral primary osteoarthritis of knee: Secondary | ICD-10-CM | POA: Diagnosis not present

## 2018-06-05 DIAGNOSIS — R748 Abnormal levels of other serum enzymes: Secondary | ICD-10-CM | POA: Diagnosis not present

## 2018-06-05 DIAGNOSIS — L03115 Cellulitis of right lower limb: Secondary | ICD-10-CM | POA: Diagnosis not present

## 2018-06-05 DIAGNOSIS — J4541 Moderate persistent asthma with (acute) exacerbation: Secondary | ICD-10-CM | POA: Diagnosis not present

## 2018-06-05 DIAGNOSIS — J9601 Acute respiratory failure with hypoxia: Secondary | ICD-10-CM | POA: Diagnosis not present

## 2018-06-05 DIAGNOSIS — M25562 Pain in left knee: Secondary | ICD-10-CM | POA: Diagnosis not present

## 2018-06-05 DIAGNOSIS — L89620 Pressure ulcer of left heel, unstageable: Secondary | ICD-10-CM | POA: Diagnosis not present

## 2018-06-05 DIAGNOSIS — N3000 Acute cystitis without hematuria: Secondary | ICD-10-CM | POA: Diagnosis not present

## 2018-06-05 DIAGNOSIS — M25561 Pain in right knee: Secondary | ICD-10-CM | POA: Diagnosis not present

## 2018-06-05 DIAGNOSIS — I10 Essential (primary) hypertension: Secondary | ICD-10-CM | POA: Diagnosis not present

## 2018-06-05 DIAGNOSIS — L03116 Cellulitis of left lower limb: Secondary | ICD-10-CM | POA: Diagnosis not present

## 2018-06-05 DIAGNOSIS — D72825 Bandemia: Secondary | ICD-10-CM | POA: Diagnosis not present

## 2018-06-05 DIAGNOSIS — J45901 Unspecified asthma with (acute) exacerbation: Secondary | ICD-10-CM | POA: Diagnosis not present

## 2018-06-05 DIAGNOSIS — E034 Atrophy of thyroid (acquired): Secondary | ICD-10-CM | POA: Diagnosis not present

## 2018-06-05 DIAGNOSIS — I5022 Chronic systolic (congestive) heart failure: Secondary | ICD-10-CM | POA: Diagnosis not present

## 2018-06-10 DIAGNOSIS — Z03818 Encounter for observation for suspected exposure to other biological agents ruled out: Secondary | ICD-10-CM | POA: Diagnosis not present

## 2018-06-10 DIAGNOSIS — I10 Essential (primary) hypertension: Secondary | ICD-10-CM | POA: Diagnosis not present

## 2018-06-10 DIAGNOSIS — K219 Gastro-esophageal reflux disease without esophagitis: Secondary | ICD-10-CM | POA: Diagnosis not present

## 2018-06-10 DIAGNOSIS — R791 Abnormal coagulation profile: Secondary | ICD-10-CM | POA: Diagnosis not present

## 2018-06-10 DIAGNOSIS — Z79899 Other long term (current) drug therapy: Secondary | ICD-10-CM | POA: Diagnosis not present

## 2018-06-10 DIAGNOSIS — Z5181 Encounter for therapeutic drug level monitoring: Secondary | ICD-10-CM | POA: Diagnosis not present

## 2018-06-10 DIAGNOSIS — K922 Gastrointestinal hemorrhage, unspecified: Secondary | ICD-10-CM | POA: Diagnosis not present

## 2018-06-18 ENCOUNTER — Inpatient Hospital Stay: Admission: RE | Admit: 2018-06-18 | Payer: PPO | Source: Ambulatory Visit

## 2018-07-12 DIAGNOSIS — M25561 Pain in right knee: Secondary | ICD-10-CM | POA: Diagnosis not present

## 2018-07-12 DIAGNOSIS — M25562 Pain in left knee: Secondary | ICD-10-CM | POA: Diagnosis not present

## 2018-07-23 DIAGNOSIS — M170 Bilateral primary osteoarthritis of knee: Secondary | ICD-10-CM | POA: Diagnosis not present

## 2018-07-26 ENCOUNTER — Telehealth: Payer: Self-pay | Admitting: Family Medicine

## 2018-07-26 ENCOUNTER — Encounter: Payer: Self-pay | Admitting: Family Medicine

## 2018-07-26 ENCOUNTER — Other Ambulatory Visit: Payer: Self-pay | Admitting: Family Medicine

## 2018-07-26 ENCOUNTER — Ambulatory Visit (INDEPENDENT_AMBULATORY_CARE_PROVIDER_SITE_OTHER): Payer: PPO | Admitting: Family Medicine

## 2018-07-26 ENCOUNTER — Other Ambulatory Visit: Payer: Self-pay

## 2018-07-26 VITALS — BP 146/76 | Temp 98.8°F | Ht 61.5 in | Wt 197.0 lb

## 2018-07-26 DIAGNOSIS — R300 Dysuria: Secondary | ICD-10-CM | POA: Diagnosis not present

## 2018-07-26 MED ORDER — SULFAMETHOXAZOLE-TRIMETHOPRIM 800-160 MG PO TABS: 1.0000 | ORAL_TABLET | Freq: Two times a day (BID) | ORAL | 0 refills | Status: DC

## 2018-07-26 NOTE — Progress Notes (Signed)
BP (!) 146/76   Temp 98.8 F (37.1 C) (Oral)   Ht 5' 1.5" (1.562 m)   Wt 197 lb (89.4 kg)   BMI 36.62 kg/m    Subjective:    Patient ID: Jessica Mcgee, female    DOB: 1950-04-15, 68 y.o.   MRN: [id number]  HPI: Jessica Mcgee is a 68 y.o. female  Chief Complaint  Patient presents with  . Urinary Frequency    patient stated that has had peinful and burning urination. since this morning. tried cystex OTC    . This visit was completed via telephone due to the restrictions of the COVID-19 pandemic. All issues as above were discussed and addressed but no physical exam was performed. If it was felt that the patient should be evaluated in the office, they were directed there. The patient verbally consented to this visit. Patient was unable to complete an audio/visual visit due to Technical difficulties,Lack of internet. Due to the catastrophic nature of the COVID-19 pandemic, this visit was done through audio contact only. . Location of the patient: home . Location of the provider: work . Those involved with this call:  . Provider: Merrie Roof, PA-C . CMA: Lesle Chris, [place] . Front Desk/Registration: Jill Side  . Time spent on call: 15 minutes on the phone discussing health concerns. 5 minutes total spent in review of patient's record and preparation of their chart. I verified patient identity using two factors (patient name and date of birth). Patient consents verbally to being seen via telemedicine visit today.   Patient presenting for 1 day of significant dysuria, urinary frequency that woke her up at 2 am and has persisted. States she used to have UTIs like this all the time but has not had an issue in years. Trying cystex which does provide some temporary relief. Denies fevers, chills, abdominal pain, N/V/D.   Relevant past medical, surgical, family and social history reviewed and updated as indicated. Interim medical history since our last visit reviewed. Allergies and  medications reviewed and updated.  Review of Systems  Per HPI unless specifically indicated above     Objective:    BP (!) 146/76   Temp 98.8 F (37.1 C) (Oral)   Ht 5' 1.5" (1.562 m)   Wt 197 lb (89.4 kg)   BMI 36.62 kg/m   Wt Readings from Last 3 Encounters:  07/26/18 197 lb (89.4 kg)  03/01/18 210 lb 3.2 oz (95.3 kg)  01/28/18 213 lb (96.6 kg)    Physical Exam  Unable to perform PE as patient did not have video technology for today's visit.   Results for orders placed or performed in visit on 90/24/09  Basic metabolic panel  Result Value Ref Range   Glucose 90 65 - 99 mg/dL   BUN 13 8 - 27 mg/dL   Creatinine, Ser 0.62 0.57 - 1.00 mg/dL   GFR calc non Af Amer 95 >59 mL/min/1.73   GFR calc Af Amer 109 >59 mL/min/1.73   BUN/Creatinine Ratio 21 12 - 28   Sodium 139 134 - 144 mmol/L   Potassium 3.6 3.5 - 5.2 mmol/L   Chloride 93 (L) 96 - 106 mmol/L   CO2 25 20 - 29 mmol/L   Calcium 9.6 8.7 - 10.3 mg/dL      Assessment & Plan:   Problem List Items Addressed This Visit    None    Visit Diagnoses    Dysuria    -  Primary   Will  obtain U/A this afternoon and treat basedon results. Continue cystex, push fluids, probiotics and cranberry prn.    Relevant Orders   UA/M w/rflx Culture, Routine       Follow up plan: Return if symptoms worsen or fail to improve.

## 2018-07-26 NOTE — Addendum Note (Signed)
Addended by: Dorris Fetch on: 07/26/2018 01:39 PM   Modules accepted: Orders

## 2018-07-26 NOTE — Telephone Encounter (Signed)
Copied from [place] [id number]. Topic: Quick Communication - See Telephone Encounter >> Jul 26, 2018  3:58 PM Sheran Luz wrote: CRM for notification. See Telephone encounter for: 07/26/18.  Patient calling to check status of results from today. Please advise.

## 2018-07-28 LAB — UA/M W/RFLX CULTURE, ROUTINE
Bilirubin, UA: NEGATIVE
Glucose, UA: NEGATIVE
Ketones, UA: NEGATIVE
Nitrite, UA: NEGATIVE
Specific Gravity, UA: 1.015 (ref 1.005–1.030)
Urobilinogen, Ur: 0.2 mg/dL (ref 0.2–1.0)
pH, UA: 7 (ref 5.0–7.5)

## 2018-07-28 LAB — MICROSCOPIC EXAMINATION: RBC, Urine: >30 /hpf — AB (ref 0–2)

## 2018-07-28 LAB — URINE CULTURE, REFLEX

## 2018-08-06 DIAGNOSIS — M170 Bilateral primary osteoarthritis of knee: Secondary | ICD-10-CM | POA: Diagnosis not present

## 2018-08-09 DIAGNOSIS — M179 Osteoarthritis of knee, unspecified: Secondary | ICD-10-CM | POA: Diagnosis not present

## 2018-08-09 DIAGNOSIS — M25562 Pain in left knee: Secondary | ICD-10-CM | POA: Diagnosis not present

## 2018-08-09 DIAGNOSIS — M25561 Pain in right knee: Secondary | ICD-10-CM | POA: Diagnosis not present

## 2018-08-09 DIAGNOSIS — G894 Chronic pain syndrome: Secondary | ICD-10-CM | POA: Diagnosis not present

## 2018-08-13 DIAGNOSIS — M170 Bilateral primary osteoarthritis of knee: Secondary | ICD-10-CM | POA: Diagnosis not present

## 2018-08-20 DIAGNOSIS — M170 Bilateral primary osteoarthritis of knee: Secondary | ICD-10-CM | POA: Diagnosis not present

## 2018-08-27 DIAGNOSIS — M170 Bilateral primary osteoarthritis of knee: Secondary | ICD-10-CM | POA: Diagnosis not present

## 2018-08-29 DIAGNOSIS — M170 Bilateral primary osteoarthritis of knee: Secondary | ICD-10-CM | POA: Diagnosis not present

## 2018-09-02 DIAGNOSIS — M170 Bilateral primary osteoarthritis of knee: Secondary | ICD-10-CM | POA: Diagnosis not present

## 2018-09-02 DIAGNOSIS — M25561 Pain in right knee: Secondary | ICD-10-CM | POA: Diagnosis not present

## 2018-09-02 DIAGNOSIS — M25562 Pain in left knee: Secondary | ICD-10-CM | POA: Diagnosis not present

## 2018-09-03 DIAGNOSIS — M170 Bilateral primary osteoarthritis of knee: Secondary | ICD-10-CM | POA: Diagnosis not present

## 2018-09-23 ENCOUNTER — Other Ambulatory Visit: Payer: Self-pay | Admitting: Family Medicine

## 2018-09-23 DIAGNOSIS — Z1231 Encounter for screening mammogram for malignant neoplasm of breast: Secondary | ICD-10-CM

## 2018-10-08 DIAGNOSIS — M170 Bilateral primary osteoarthritis of knee: Secondary | ICD-10-CM | POA: Diagnosis not present

## 2018-10-22 DIAGNOSIS — M170 Bilateral primary osteoarthritis of knee: Secondary | ICD-10-CM | POA: Diagnosis not present

## 2018-10-30 DIAGNOSIS — G894 Chronic pain syndrome: Secondary | ICD-10-CM | POA: Diagnosis not present

## 2018-10-30 DIAGNOSIS — M25562 Pain in left knee: Secondary | ICD-10-CM | POA: Diagnosis not present

## 2018-10-30 DIAGNOSIS — M25561 Pain in right knee: Secondary | ICD-10-CM | POA: Diagnosis not present

## 2018-11-05 ENCOUNTER — Other Ambulatory Visit: Payer: Self-pay

## 2018-11-05 ENCOUNTER — Ambulatory Visit
Admission: RE | Admit: 2018-11-05 | Discharge: 2018-11-05 | Disposition: A | Discharge status: Home / Self Care | Payer: PPO | Source: Ambulatory Visit | Attending: Family Medicine | Admitting: Family Medicine

## 2018-11-05 DIAGNOSIS — Z780 Asymptomatic menopausal state: Secondary | ICD-10-CM | POA: Diagnosis not present

## 2018-11-05 DIAGNOSIS — Z13820 Encounter for screening for osteoporosis: Secondary | ICD-10-CM | POA: Insufficient documentation

## 2018-11-05 DIAGNOSIS — Z1231 Encounter for screening mammogram for malignant neoplasm of breast: Secondary | ICD-10-CM | POA: Diagnosis not present

## 2018-11-11 ENCOUNTER — Encounter: Payer: Self-pay | Admitting: Family Medicine

## 2018-12-24 IMAGING — MG MM DIGITAL SCREENING BILAT W/ TOMO W/ CAD
8 of 12 series · 8 of 28 positions shown · non-contrast
Comparison: Previous exam(s).

CLINICAL DATA: Screening.

EXAM:
2D DIGITAL SCREENING BILATERAL MAMMOGRAM WITH 3D TOMO WITH CAD

[L MLO synth-2D]
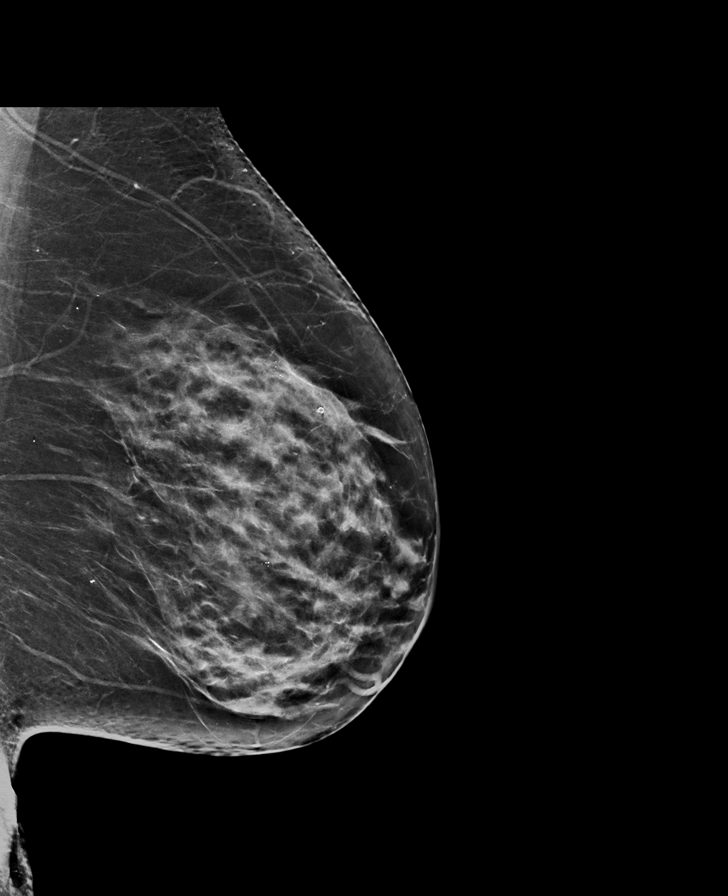

[R MLO]
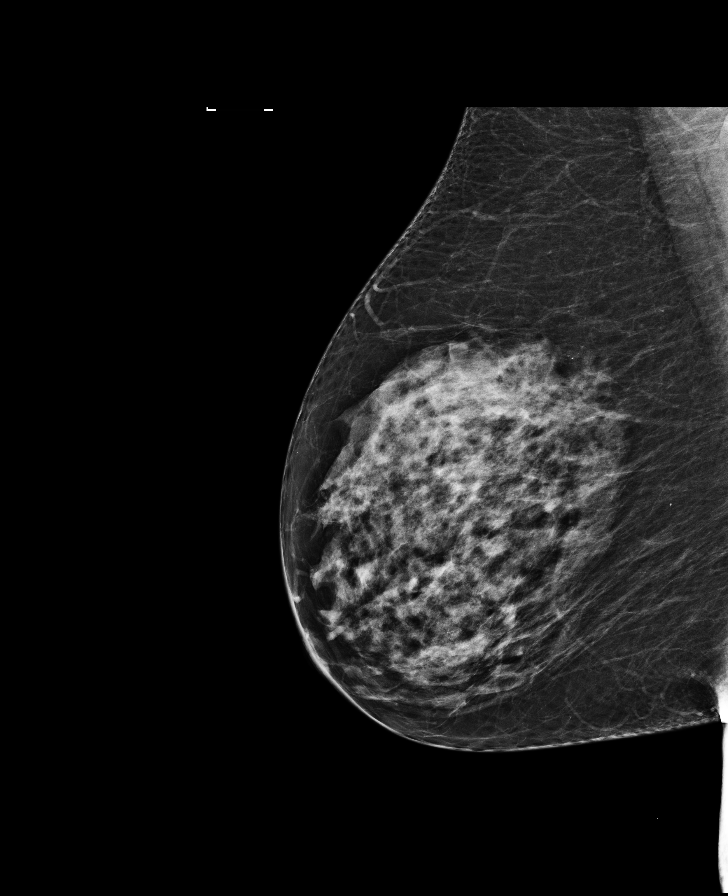

[L CC]
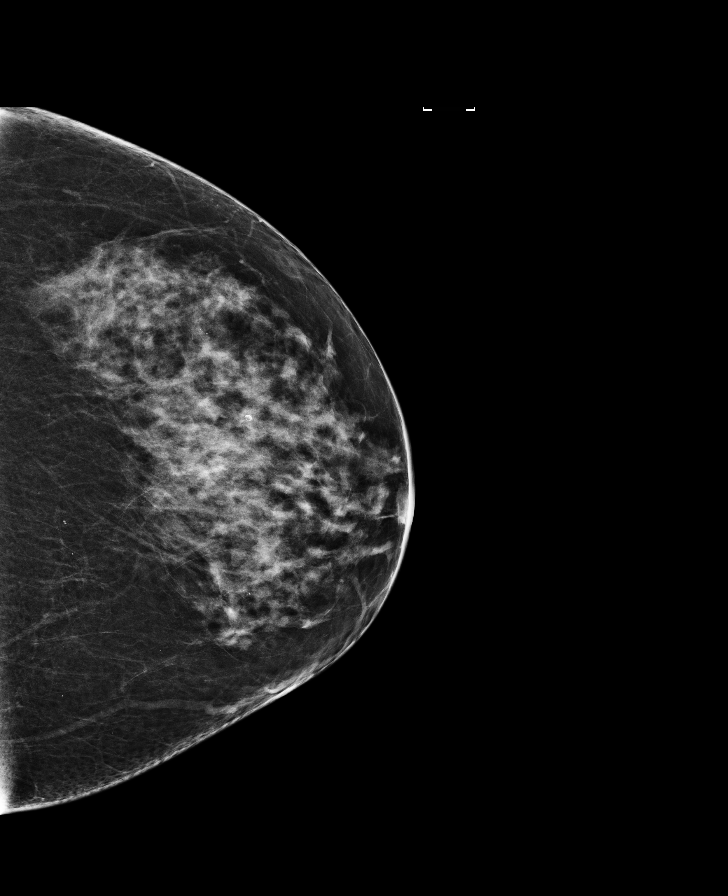

[R MLO synth-2D]
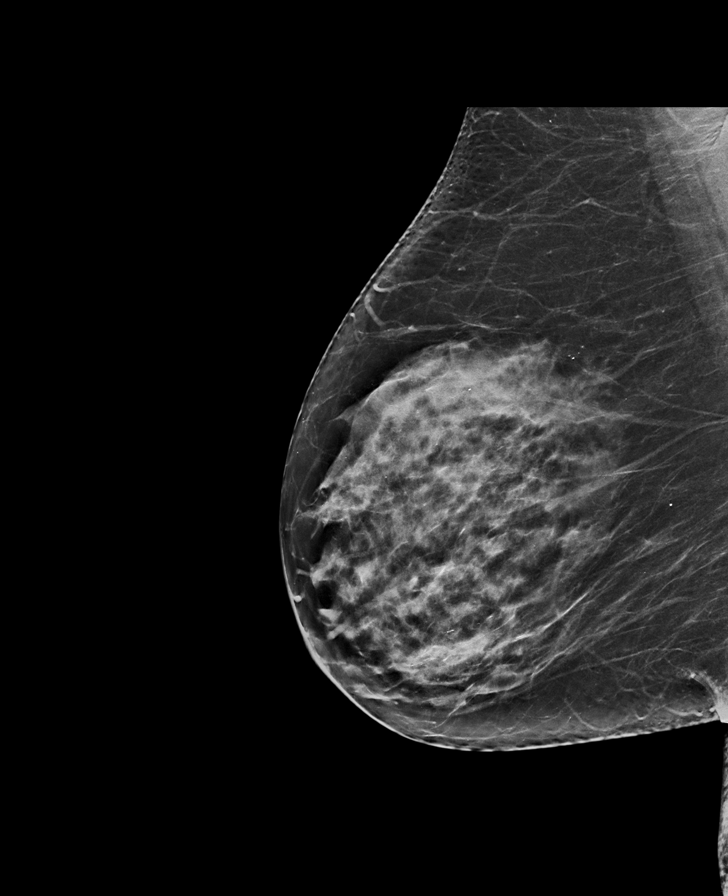

[L CC synth-2D]
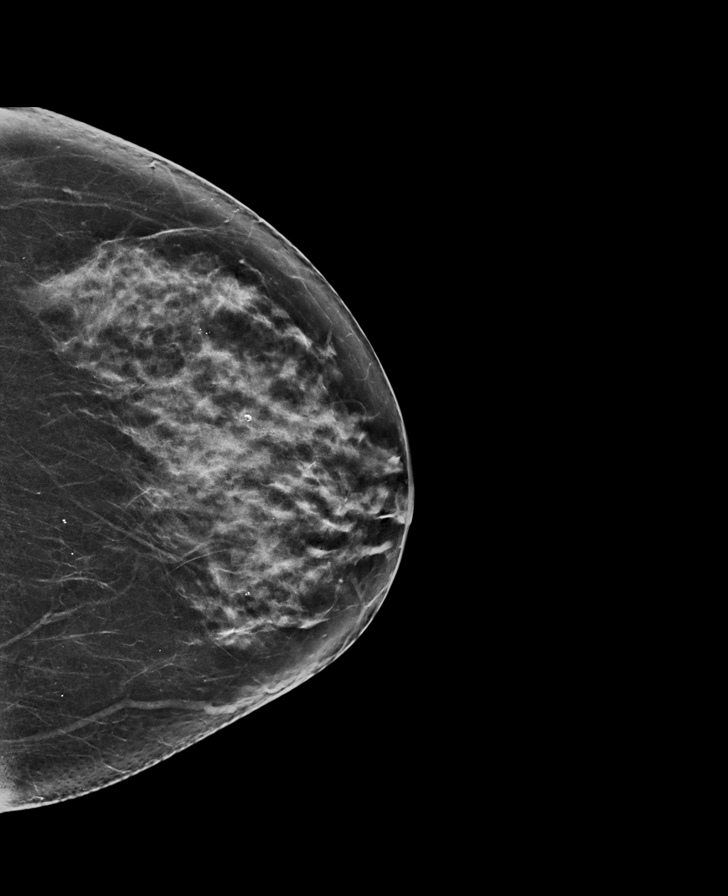

[R CC synth-2D]
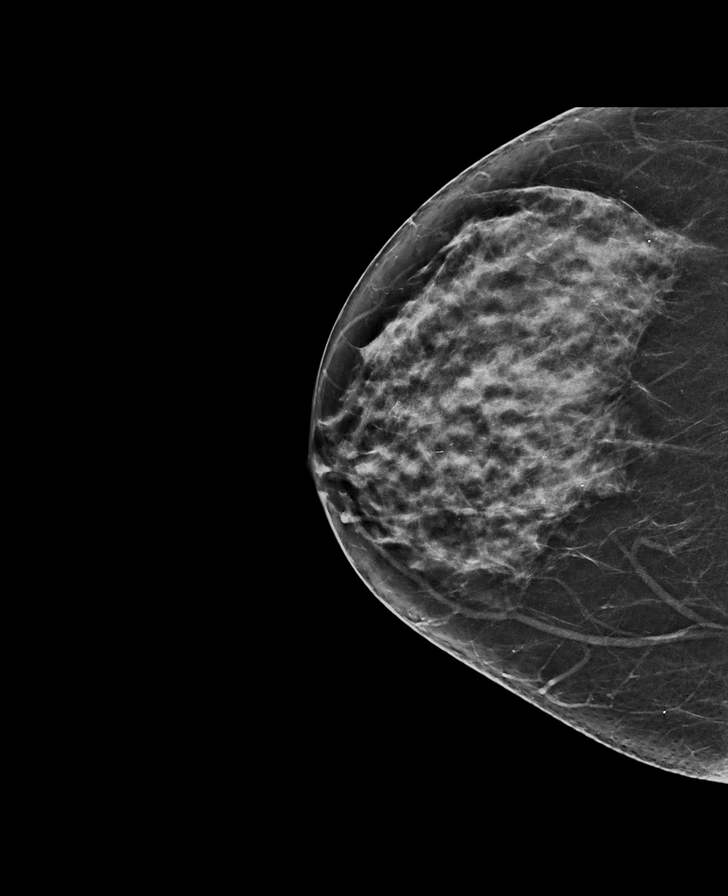

[L MLO]
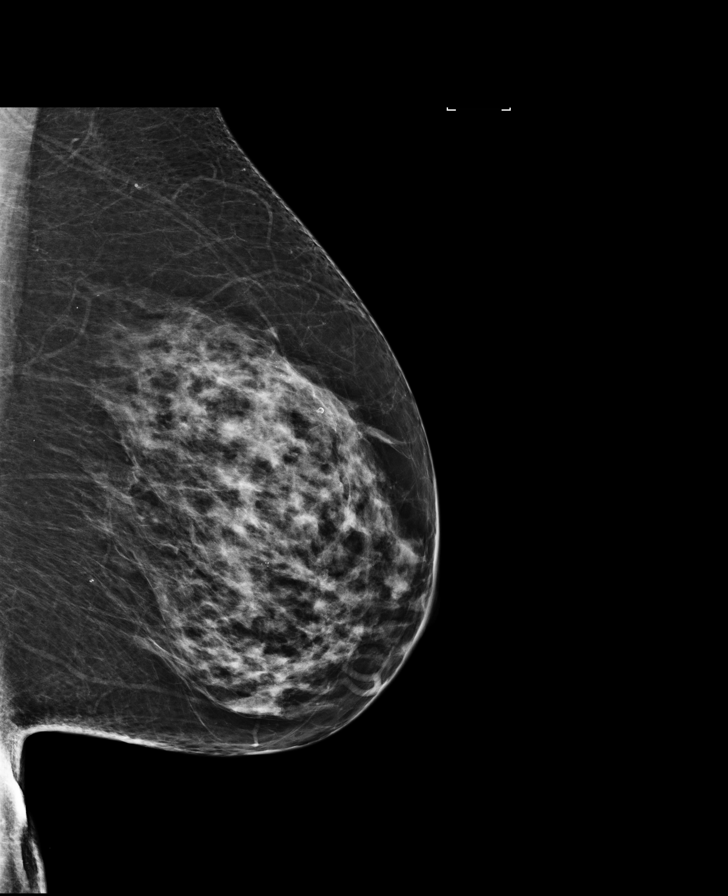

[R CC]
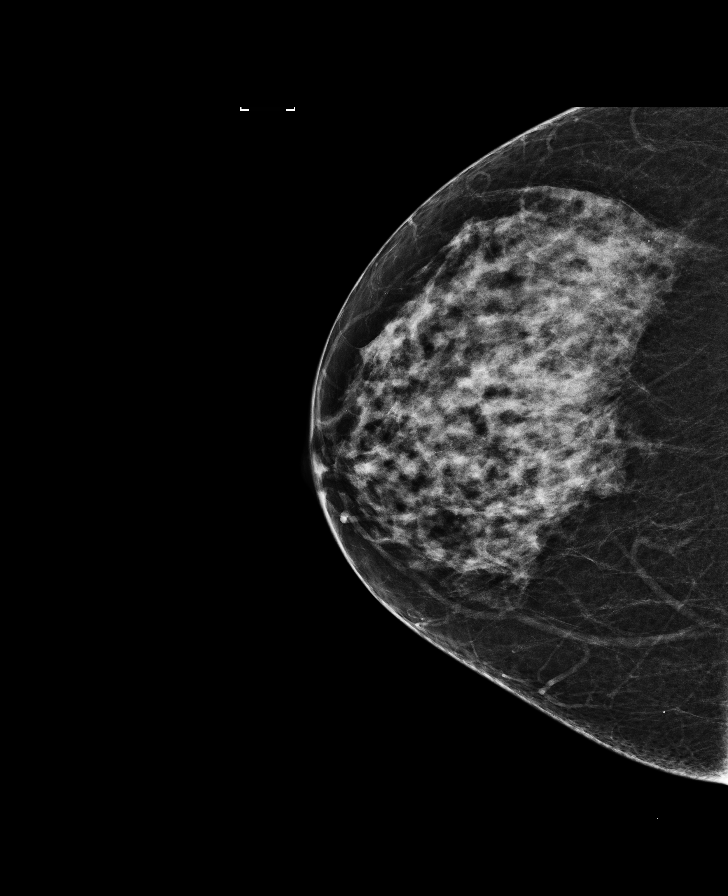

[8 of 28 positions shown; findings below may reference images not displayed]

ACR Breast Density Category c: The breast tissue is heterogeneously
dense, which may obscure small masses.
FINDINGS: There are no findings suspicious for malignancy. Images were
processed with CAD.
IMPRESSION: No mammographic evidence of malignancy. A result letter of this
screening mammogram will be mailed directly to the patient.

RECOMMENDATION:
Screening mammogram in one year. (Code:UA-9-KQN)

BI-RADS CATEGORY  1: Negative.

## 2019-01-22 DIAGNOSIS — M25562 Pain in left knee: Secondary | ICD-10-CM | POA: Diagnosis not present

## 2019-01-22 DIAGNOSIS — G894 Chronic pain syndrome: Secondary | ICD-10-CM | POA: Diagnosis not present

## 2019-01-22 DIAGNOSIS — M170 Bilateral primary osteoarthritis of knee: Secondary | ICD-10-CM | POA: Diagnosis not present

## 2019-01-22 DIAGNOSIS — M25561 Pain in right knee: Secondary | ICD-10-CM | POA: Diagnosis not present

## 2019-02-03 ENCOUNTER — Encounter: Payer: PPO | Admitting: Family Medicine

## 2019-02-20 ENCOUNTER — Other Ambulatory Visit: Payer: Self-pay

## 2019-02-20 DIAGNOSIS — I10 Essential (primary) hypertension: Secondary | ICD-10-CM

## 2019-02-20 NOTE — Telephone Encounter (Signed)
Refill request for HCTZ. LOV: 07/26/2018

## 2019-02-21 MED ORDER — HYDROCHLOROTHIAZIDE 25 MG PO TABS: 25.0000 mg | ORAL_TABLET | Freq: Every day | ORAL | 0 refills | Status: DC

## 2019-03-10 ENCOUNTER — Other Ambulatory Visit: Payer: Self-pay

## 2019-03-10 NOTE — Telephone Encounter (Signed)
Needs appointment

## 2019-03-10 NOTE — Telephone Encounter (Signed)
[facility] faxed a Rx refill request on potassium CL ER 10 MEQ tab

## 2019-03-11 ENCOUNTER — Telehealth: Payer: Self-pay

## 2019-03-11 NOTE — Telephone Encounter (Signed)
Called patient to let her know that she needs an appointment. Patient stated she will call back to schedule a follow up

## 2019-03-11 NOTE — Telephone Encounter (Signed)
Patient will call back to schedule ?

## 2019-03-28 ENCOUNTER — Other Ambulatory Visit: Payer: Self-pay | Admitting: Internal Medicine

## 2019-03-31 ENCOUNTER — Other Ambulatory Visit: Payer: Self-pay

## 2019-03-31 ENCOUNTER — Encounter: Payer: Self-pay | Admitting: Internal Medicine

## 2019-03-31 ENCOUNTER — Ambulatory Visit (INDEPENDENT_AMBULATORY_CARE_PROVIDER_SITE_OTHER): Payer: PPO | Admitting: Internal Medicine

## 2019-03-31 VITALS — BP 126/68 | HR 71 | Temp 97.8°F | Ht 61.5 in | Wt 194.0 lb

## 2019-03-31 DIAGNOSIS — Z8619 Personal history of other infectious and parasitic diseases: Secondary | ICD-10-CM

## 2019-03-31 DIAGNOSIS — I10 Essential (primary) hypertension: Secondary | ICD-10-CM

## 2019-03-31 DIAGNOSIS — K219 Gastro-esophageal reflux disease without esophagitis: Secondary | ICD-10-CM | POA: Diagnosis not present

## 2019-03-31 DIAGNOSIS — M170 Bilateral primary osteoarthritis of knee: Secondary | ICD-10-CM

## 2019-03-31 MED ORDER — POTASSIUM CHLORIDE ER 10 MEQ PO TBCR: 10.0000 meq | EXTENDED_RELEASE_TABLET | Freq: Every day | ORAL | 1 refills | Status: DC

## 2019-03-31 MED ORDER — HYDROCHLOROTHIAZIDE 25 MG PO TABS: 25.0000 mg | ORAL_TABLET | Freq: Every day | ORAL | 1 refills | Status: DC

## 2019-03-31 NOTE — Progress Notes (Signed)
Date:  03/31/2019   Name:  Jessica Mcgee [facility]   DOB:  01-Apr-1950   MRN:  [id number]   Chief Complaint: Establish Care, Hypertension (RF HCTZ), and Low Potassium (Rf on potassium )  Hypertension This is a chronic problem. The problem is controlled (has lost 30 lbs recently). Pertinent negatives include no chest pain, headaches, palpitations or shortness of breath. Past treatments include diuretics. The current treatment provides significant improvement. Compliance problems include medication side effects (hypokalemia).   Gastroesophageal Reflux She complains of heartburn. She reports no abdominal pain, no chest pain or no coughing. This is a recurrent problem. The problem occurs rarely. Pertinent negatives include no fatigue. She has tried a histamine-2 antagonist (pepcid once a day) for the symptoms. The treatment provided significant relief.  Knee Pain  There was no injury mechanism. The pain is present in the left knee and right knee. The quality of the pain is described as aching. The pain is mild. Pertinent negatives include no numbness. The symptoms are aggravated by movement and weight bearing. Treatments tried: sometimes vicodin at bedtime if pain is severe.    Lab Results  Component Value Date   CREATININE 0.62 03/01/2018   BUN 13 03/01/2018   NA 139 03/01/2018   K 3.6 03/01/2018   CL 93 (L) 03/01/2018   CO2 25 03/01/2018   Lab Results  Component Value Date   CHOL 215 (H) 01/28/2018   HDL 68 01/28/2018   LDLCALC 127 (H) 01/28/2018   TRIG 102 01/28/2018   CHOLHDL 3.2 01/28/2018   Lab Results  Component Value Date   TSH 2.730 01/28/2018   No results found for: HGBA1C  MRI Abdomen 05/2016: IMPRESSION: Left hepatic lobe 6.2 cm lesion demonstrates enhancement characteristics compatible with a hemangioma. There is a 5 cm cyst associated with/adjacent to the hemangioma.  Hepatic segment 8 hemangioma.  Review of Systems  Constitutional: Negative for appetite change,  fatigue, fever and unexpected weight change.  HENT: Negative for tinnitus and trouble swallowing.   Eyes: Negative for visual disturbance.  Respiratory: Negative for cough, chest tightness and shortness of breath.   Cardiovascular: Negative for chest pain, palpitations and leg swelling.  Gastrointestinal: Positive for heartburn. Negative for abdominal pain.  Genitourinary: Negative for dysuria and hematuria.  Musculoskeletal: Negative for arthralgias.  Neurological: Negative for tremors, numbness and headaches.  Psychiatric/Behavioral: Negative for dysphoric mood and sleep disturbance. The patient is not nervous/anxious.     Patient Active Problem List   Diagnosis Date Noted  . Osteoarthritis of both knees 07/04/2017  . 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 Acid reflux 10/07/2015  . Essential hypertension 02/10/2015  . Fibromyalgia 08/25/2014    Allergies  Allergen Reactions  . Cymbalta [Duloxetine Hcl]     Marked fatigue  . Latex     Past Surgical History:  Procedure Laterality Date  . TUBAL LIGATION      Social History   Tobacco Use  . Smoking status: Never Smoker  . Smokeless tobacco: Never Used  Substance Use Topics  . Alcohol use: Yes    Alcohol/week: 0.0 standard drinks    Comment: occassional  . Drug use: No     Medication list has been reviewed and updated.  Current Meds  Medication Sig  . famotidine (PEPCID) 10 MG tablet Take 10 mg by mouth daily as needed for heartburn or indigestion.  . fluticasone (FLONASE) 50 MCG/ACT nasal spray Place 2 sprays into both nostrils daily.  . hydrochlorothiazide (HYDRODIURIL) 25 MG  tablet Take 1 tablet (25 mg total) by mouth daily. For further refills need office visit.  Marland Kitchen potassium chloride (K-DUR) 10 MEQ tablet Take 1 tablet (10 mEq total) by mouth daily.    PHQ 2/9 Scores 03/31/2019 01/28/2018 07/04/2017 10/23/2016  PHQ - 2 Score 4 0 0 0  PHQ- 9 Score 4 10 - -    BP Readings from Last 3 Encounters:   03/31/19 126/68  07/26/18 (!) 146/76  03/01/18 128/84    Physical Exam Vitals and nursing note reviewed.  Constitutional:      General: She is not in acute distress.    Appearance: She is well-developed.  HENT:     Head: Normocephalic and atraumatic.  Cardiovascular:     Rate and Rhythm: Normal rate and regular rhythm.     Pulses: Normal pulses.     Heart sounds: No murmur.  Pulmonary:     Effort: Pulmonary effort is normal. No respiratory distress.     Breath sounds: No wheezing or rhonchi.  Musculoskeletal:     Cervical back: Normal range of motion.     Right lower leg: No edema.     Left lower leg: No edema.  Skin:    General: Skin is warm and dry.     Capillary Refill: Capillary refill takes less than 2 seconds.     Findings: No rash.  Neurological:     General: No focal deficit present.     Mental Status: She is alert and oriented to person, place, and time.  Psychiatric:        Behavior: Behavior normal.        Thought Content: Thought content normal.     Wt Readings from Last 3 Encounters:  03/31/19 194 lb (88 kg)  07/26/18 197 lb (89.4 kg)  03/01/18 210 lb 3.2 oz (95.3 kg)    BP 126/68   Pulse 71   Temp 97.8 F (36.6 C) (Oral)   Ht 5' 1.5" (1.562 m)   Wt 194 lb (88 kg)   SpO2 96%   BMI 36.06 kg/m   Assessment and Plan: 1. Essential hypertension Clinically stable exam with well controlled BP on hctz 25 mg. Tolerating medications without side effects at this time. Pt to continue current regimen and low sodium diet; benefits of regular exercise as able discussed. - hydrochlorothiazide (HYDRODIURIL) 25 MG tablet; Take 1 tablet (25 mg total) by mouth daily. For further refills need office visit.  Dispense: 90 tablet; Refill: 1 - potassium chloride (KLOR-CON) 10 MEQ tablet; Take 1 tablet (10 mEq total) by mouth daily.  Dispense: 90 tablet; Refill: 1  2. Gastroesophageal reflux disease, unspecified whether esophagitis present Symptoms well controlled on  daily H2 blocker. No red flag signs such as weight loss, n/v, melena Will continue pepcid.  3. Primary osteoarthritis of both knees Seen by Orthopedic specialist s/p stem cell injections with good response She will continue to follow up as needed Taking hydrocodone at bedtime PRN   Partially dictated using [place]. Any errors are unintentional.  Halina Maidens, MD [facility]  03/31/2019

## 2019-04-11 DIAGNOSIS — G894 Chronic pain syndrome: Secondary | ICD-10-CM | POA: Diagnosis not present

## 2019-04-11 DIAGNOSIS — M25561 Pain in right knee: Secondary | ICD-10-CM | POA: Diagnosis not present

## 2019-04-11 DIAGNOSIS — M25562 Pain in left knee: Secondary | ICD-10-CM | POA: Diagnosis not present

## 2019-04-11 DIAGNOSIS — M170 Bilateral primary osteoarthritis of knee: Secondary | ICD-10-CM | POA: Diagnosis not present

## 2019-05-07 ENCOUNTER — Ambulatory Visit (INDEPENDENT_AMBULATORY_CARE_PROVIDER_SITE_OTHER): Payer: PPO | Admitting: Internal Medicine

## 2019-05-07 ENCOUNTER — Encounter: Payer: Self-pay | Admitting: Internal Medicine

## 2019-05-07 ENCOUNTER — Other Ambulatory Visit: Payer: Self-pay

## 2019-05-07 VITALS — BP 132/70 | HR 82 | Temp 98.3°F | Ht 61.5 in | Wt 194.0 lb

## 2019-05-07 DIAGNOSIS — K219 Gastro-esophageal reflux disease without esophagitis: Secondary | ICD-10-CM

## 2019-05-07 DIAGNOSIS — Z1231 Encounter for screening mammogram for malignant neoplasm of breast: Secondary | ICD-10-CM | POA: Diagnosis not present

## 2019-05-07 DIAGNOSIS — Z1211 Encounter for screening for malignant neoplasm of colon: Secondary | ICD-10-CM | POA: Diagnosis not present

## 2019-05-07 DIAGNOSIS — Z0000 Encounter for general adult medical examination without abnormal findings: Secondary | ICD-10-CM | POA: Diagnosis not present

## 2019-05-07 DIAGNOSIS — I10 Essential (primary) hypertension: Secondary | ICD-10-CM

## 2019-05-07 LAB — POCT URINALYSIS DIPSTICK
Bilirubin, UA: NEGATIVE
Blood, UA: NEGATIVE
Glucose, UA: NEGATIVE
Ketones, UA: NEGATIVE
Leukocytes, UA: NEGATIVE
Nitrite, UA: NEGATIVE
Protein, UA: NEGATIVE
Spec Grav, UA: 1.01 (ref 1.010–1.025)
Urobilinogen, UA: 0.2 E.U./dL
pH, UA: 7 (ref 5.0–8.0)

## 2019-05-07 NOTE — Patient Instructions (Addendum)
Covid Vaccine locations: [employer] Dept. [place]   [email or web address]

## 2019-05-07 NOTE — Progress Notes (Signed)
Date:  05/07/2019   Name:  Jessica Mcgee [facility]   DOB:  October 04, 1950   MRN:  [id number]   Chief Complaint: Annual Exam (Breast Exam. No pap- aged out.) Jessica Mcgee is a 69 y.o. female who presents today for her Complete Annual Exam. She feels fairly well. She reports exercising daily with exercise bike. She reports she is sleeping fairly well. She denies breast issues.  Mammogram  11/2018 Pap  01/2018 negative thin prep - pt wants to continue screenings DEXA  11/2018 Colonoscopy 04/2012 - will do next year ( 2009 showed polyps, 2014 was clear) Immunization History  Administered Date(s) Administered  . Td 01/28/2014    Hypertension This is a chronic problem. The problem is unchanged. The problem is controlled (at home 130-140/ 60-70). Pertinent negatives include no chest pain, headaches, orthopnea, palpitations, peripheral edema, PND or shortness of breath.  Gastroesophageal Reflux She reports no abdominal pain, no chest pain, no coughing or no wheezing. Pertinent negatives include no fatigue.    Lab Results  Component Value Date   CREATININE 0.62 03/01/2018   BUN 13 03/01/2018   NA 139 03/01/2018   K 3.6 03/01/2018   CL 93 (L) 03/01/2018   CO2 25 03/01/2018   Lab Results  Component Value Date   CHOL 215 (H) 01/28/2018   HDL 68 01/28/2018   LDLCALC 127 (H) 01/28/2018   TRIG 102 01/28/2018   CHOLHDL 3.2 01/28/2018   Lab Results  Component Value Date   TSH 2.730 01/28/2018   No results found for: HGBA1C   Review of Systems  Constitutional: Negative for chills, fatigue and fever.  HENT: Negative for congestion, hearing loss, tinnitus, trouble swallowing and voice change.   Eyes: Negative for visual disturbance.  Respiratory: Negative for cough, chest tightness, shortness of breath and wheezing.   Cardiovascular: Negative for chest pain, palpitations, orthopnea, leg swelling and PND.  Gastrointestinal: Negative for abdominal pain, constipation, diarrhea and vomiting.    Endocrine: Negative for polydipsia and polyuria.  Genitourinary: Negative for dysuria, frequency, genital sores, vaginal bleeding and vaginal discharge.  Musculoskeletal: Negative for arthralgias, gait problem and joint swelling.  Skin: Negative for color change and rash.  Neurological: Negative for dizziness, tremors, light-headedness and headaches.  Hematological: Negative for adenopathy. Does not bruise/bleed easily.  Psychiatric/Behavioral: Negative for dysphoric mood and sleep disturbance. The patient is not nervous/anxious.     Patient Active Problem List   Diagnosis Date Noted  . History of Lyme disease 03/31/2019  . Osteoarthritis of both knees 07/04/2017  . 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 Acid reflux 10/07/2015  . Essential hypertension 02/10/2015  . Fibromyalgia 08/25/2014    Allergies  Allergen Reactions  . Cymbalta [Duloxetine Hcl]     Marked fatigue  . Latex     Past Surgical History:  Procedure Laterality Date  . TUBAL LIGATION      Social History   Tobacco Use  . Smoking status: Never Smoker  . Smokeless tobacco: Never Used  Substance Use Topics  . Alcohol use: Yes    Alcohol/week: 0.0 standard drinks    Comment: occassional  . Drug use: No     Medication list has been reviewed and updated.  Current Meds  Medication Sig  . famotidine (PEPCID) 10 MG tablet Take 10 mg by mouth daily as needed for heartburn or indigestion.  . fluticasone (FLONASE) 50 MCG/ACT nasal spray Place 2 sprays into both nostrils daily.  . hydrochlorothiazide (HYDRODIURIL) 25 MG tablet Take  1 tablet (25 mg total) by mouth daily. For further refills need office visit.  Marland Kitchen potassium chloride (KLOR-CON) 10 MEQ tablet Take 1 tablet (10 mEq total) by mouth daily.    PHQ 2/9 Scores 05/07/2019 03/31/2019 01/28/2018 07/04/2017  PHQ - 2 Score 2 4 0 0  PHQ- 9 Score 7 4 10  -    BP Readings from Last 3 Encounters:  05/07/19 132/70  03/31/19 126/68  07/26/18 (!)  146/76    Physical Exam Vitals and nursing note reviewed.  Constitutional:      General: She is not in acute distress.    Appearance: She is well-developed.  HENT:     Head: Normocephalic and atraumatic.     Right Ear: Tympanic membrane and ear canal normal.     Left Ear: Tympanic membrane and ear canal normal.     Nose:     Right Sinus: No maxillary sinus tenderness.     Left Sinus: No maxillary sinus tenderness.  Eyes:     General: No scleral icterus.       Right eye: No discharge.        Left eye: No discharge.     Conjunctiva/sclera: Conjunctivae normal.  Neck:     Thyroid: No thyromegaly.     Vascular: No carotid bruit.  Cardiovascular:     Rate and Rhythm: Normal rate and regular rhythm.     Pulses: Normal pulses.     Heart sounds: Normal heart sounds.  Pulmonary:     Effort: Pulmonary effort is normal. No respiratory distress.     Breath sounds: No wheezing.  Chest:     Breasts:        Right: No mass, nipple discharge, skin change or tenderness.        Left: No mass, nipple discharge, skin change or tenderness.  Abdominal:     General: Bowel sounds are normal.     Palpations: Abdomen is soft.     Tenderness: There is no abdominal tenderness.  Musculoskeletal:        General: Normal range of motion.     Cervical back: Normal range of motion. No erythema.  Lymphadenopathy:     Cervical: No cervical adenopathy.  Skin:    General: Skin is warm and dry.     Findings: No rash.  Neurological:     Mental Status: She is alert and oriented to person, place, and time.     Cranial Nerves: No cranial nerve deficit.     Sensory: No sensory deficit.     Deep Tendon Reflexes: Reflexes are normal and symmetric.  Psychiatric:        Speech: Speech normal.        Behavior: Behavior normal.        Thought Content: Thought content normal.     Wt Readings from Last 3 Encounters:  05/07/19 194 lb (88 kg)  03/31/19 194 lb (88 kg)  07/26/18 197 lb (89.4 kg)    BP 132/70    Pulse 82   Temp 98.3 F (36.8 C) (Oral)   Ht 5' 1.5" (1.562 m)   Wt 194 lb (88 kg)   SpO2 98%   BMI 36.06 kg/m   Assessment and Plan: 1. Annual physical exam Normal exam except for weight Continue to work on exercise, diet changes - Lipid panel - POCT urinalysis dipstick  2. Encounter for screening mammogram for breast cancer Due in September - will order at next OV  3. Essential hypertension Clinically stable  exam with well controlled BP on hctz. Tolerating medications without side effects at this time. Pt to continue current regimen and low sodium diet; benefits of regular exercise as able discussed. - CBC with Differential/Platelet - Comprehensive metabolic panel - TSH  4. Gastroesophageal reflux disease, unspecified whether esophagitis present Symptoms well controlled on daily H2 blocker No red flag signs such as weight loss, n/v, melena Will continue pepcid otc. - CBC with Differential/Platelet  5. Colon cancer screening Pt would like to defer to next year since her last colonoscopy was without polyps   Partially dictated using Dragon software. Any errors are unintentional.  Halina Maidens, MD [facility]  05/07/2019

## 2019-05-08 ENCOUNTER — Encounter: Payer: Self-pay | Admitting: Internal Medicine

## 2019-05-08 DIAGNOSIS — E782 Mixed hyperlipidemia: Secondary | ICD-10-CM | POA: Insufficient documentation

## 2019-05-08 LAB — CBC WITH DIFFERENTIAL/PLATELET
Basophils Absolute: 0.1 10*3/uL (ref 0.0–0.2)
Basos: 1 %
EOS (ABSOLUTE): 0.1 10*3/uL (ref 0.0–0.4)
Eos: 1 %
Hematocrit: 41.8 % (ref 34.0–46.6)
Hemoglobin: 14.3 g/dL (ref 11.1–15.9)
Immature Grans (Abs): 0 10*3/uL (ref 0.0–0.1)
Immature Granulocytes: 0 %
Lymphocytes Absolute: 2.9 10*3/uL (ref 0.7–3.1)
Lymphs: 37 %
MCH: 30.3 pg (ref 26.6–33.0)
MCHC: 34.2 g/dL (ref 31.5–35.7)
MCV: 89 fL (ref 79–97)
Monocytes Absolute: 0.7 10*3/uL (ref 0.1–0.9)
Monocytes: 8 %
Neutrophils Absolute: 4.2 10*3/uL (ref 1.4–7.0)
Neutrophils: 53 %
Platelets: 291 10*3/uL (ref 150–450)
RBC: 4.72 x10E6/uL (ref 3.77–5.28)
RDW: 12.2 % (ref 11.7–15.4)
WBC: 7.9 10*3/uL (ref 3.4–10.8)

## 2019-05-08 LAB — COMPREHENSIVE METABOLIC PANEL
ALT: 22 IU/L (ref 0–32)
AST: 22 IU/L (ref 0–40)
Albumin/Globulin Ratio: 2.2 (ref 1.2–2.2)
Albumin: 4.6 g/dL (ref 3.8–4.8)
Alkaline Phosphatase: 52 IU/L (ref 39–117)
BUN/Creatinine Ratio: 13 (ref 12–28)
BUN: 10 mg/dL (ref 8–27)
Bilirubin Total: 0.3 mg/dL (ref 0.0–1.2)
CO2: 25 mmol/L (ref 20–29)
Calcium: 9.9 mg/dL (ref 8.7–10.3)
Chloride: 99 mmol/L (ref 96–106)
Creatinine, Ser: 0.8 mg/dL (ref 0.57–1.00)
GFR calc Af Amer: 89 mL/min/{1.73_m2} (ref >59)
GFR calc non Af Amer: 77 mL/min/{1.73_m2} (ref >59)
Globulin, Total: 2.1 g/dL (ref 1.5–4.5)
Glucose: 81 mg/dL (ref 65–99)
Potassium: 3.6 mmol/L (ref 3.5–5.2)
Sodium: 140 mmol/L (ref 134–144)
Total Protein: 6.7 g/dL (ref 6.0–8.5)

## 2019-05-08 LAB — LIPID PANEL
Chol/HDL Ratio: 3.3 ratio (ref 0.0–4.4)
Cholesterol, Total: 236 mg/dL — ABNORMAL HIGH (ref 100–199)
HDL: 72 mg/dL (ref >39)
LDL Chol Calc (NIH): 131 mg/dL — ABNORMAL HIGH (ref 0–99)
Triglycerides: 189 mg/dL — ABNORMAL HIGH (ref 0–149)
VLDL Cholesterol Cal: 33 mg/dL (ref 5–40)

## 2019-05-08 LAB — TSH: TSH: 2.78 u[IU]/mL (ref 0.450–4.500)

## 2019-06-05 DIAGNOSIS — M25562 Pain in left knee: Secondary | ICD-10-CM | POA: Diagnosis not present

## 2019-06-05 DIAGNOSIS — G894 Chronic pain syndrome: Secondary | ICD-10-CM | POA: Diagnosis not present

## 2019-06-05 DIAGNOSIS — M25561 Pain in right knee: Secondary | ICD-10-CM | POA: Diagnosis not present

## 2019-06-05 DIAGNOSIS — M170 Bilateral primary osteoarthritis of knee: Secondary | ICD-10-CM | POA: Diagnosis not present

## 2019-06-06 ENCOUNTER — Ambulatory Visit: Payer: PPO | Attending: Internal Medicine

## 2019-06-06 ENCOUNTER — Other Ambulatory Visit: Payer: Self-pay

## 2019-06-06 DIAGNOSIS — Z23 Encounter for immunization: Secondary | ICD-10-CM

## 2019-06-06 NOTE — Progress Notes (Signed)
   Covid-19 Vaccination Clinic  Name:  Azailya Mcgannon    MRN: [id number] DOB: 28-Nov-1950  06/06/2019  Ms. Stonehocker was observed post Covid-19 immunization for 15 minutes without incident. She was provided with Vaccine Information Sheet and instruction to access the V-Safe system.   Ms. Spragg was instructed to call 911 with any severe reactions post vaccine: Marland Kitchen Difficulty breathing  . Swelling of face and throat  . A fast heartbeat  . A bad rash all over body  . Dizziness and weakness   Immunizations Administered    Name Date Dose VIS Date Route   Pfizer COVID-19 Vaccine 06/06/2019 10:00 AM 0.3 mL 02/14/2019 Intramuscular   Manufacturer: [place]   Lot: [id number]   [place]: [id number]

## 2019-06-16 ENCOUNTER — Ambulatory Visit (INDEPENDENT_AMBULATORY_CARE_PROVIDER_SITE_OTHER): Payer: PPO

## 2019-06-16 DIAGNOSIS — Z0000 Encounter for general adult medical examination without abnormal findings: Secondary | ICD-10-CM

## 2019-06-16 NOTE — Patient Instructions (Signed)
Jessica Mcgee , Thank you for taking time to come for your Medicare Wellness Visit. I appreciate your ongoing commitment to your health goals. Please review the following plan we discussed and let me know if I can assist you in the future.   Screening recommendations/referrals: Colonoscopy: done 05/02/12 Mammogram: done 11/05/18 Bone Density: done 11/05/18 Recommended yearly ophthalmology/optometry visit for glaucoma screening and checkup Recommended yearly dental visit for hygiene and checkup  Vaccinations: Influenza vaccine: postponed Pneumococcal vaccine: postponed Tdap vaccine: done 01/28/14   Covid-19: 1st dose 06/06/19  Advanced directives: Please bring a copy of your health care power of attorney and living will to the office at your convenience.  Conditions/risks identified: Recommend increasing physical activity   Next appointment: Please follow up in one year for your Medicare Annual Wellness visit.     Preventive Care 17 Years and Older, Female Preventive care refers to lifestyle choices and visits with your health care provider that can promote health and wellness. What does preventive care include?  A yearly physical exam. This is also called an annual well check.  Dental exams once or twice a year.  Routine eye exams. Ask your health care provider how often you should have your eyes checked.  Personal lifestyle choices, including:  Daily care of your teeth and gums.  Regular physical activity.  Eating a healthy diet.  Avoiding tobacco and drug use.  Limiting alcohol use.  Practicing safe sex.  Taking low-dose aspirin every day.  Taking vitamin and mineral supplements as recommended by your health care provider. What happens during an annual well check? The services and screenings done by your health care provider during your annual well check will depend on your age, overall health, lifestyle risk factors, and family history of disease. Counseling  Your health  care provider may ask you questions about your:  Alcohol use.  Tobacco use.  Drug use.  Emotional well-being.  Home and relationship well-being.  Sexual activity.  Eating habits.  History of falls.  Memory and ability to understand (cognition).  Work and work Statistician.  Reproductive health. Screening  You may have the following tests or measurements:  Height, weight, and BMI.  Blood pressure.  Lipid and cholesterol levels. These may be checked every 5 years, or more frequently if you are over 68 years old.  Skin check.  Lung cancer screening. You may have this screening every year starting at age 110 if you have a 30-pack-year history of smoking and currently smoke or have quit within the past 15 years.  Fecal occult blood test (FOBT) of the stool. You may have this test every year starting at age 19.  Flexible sigmoidoscopy or colonoscopy. You may have a sigmoidoscopy every 5 years or a colonoscopy every 10 years starting at age 64.  Hepatitis C blood test.  Hepatitis B blood test.  Sexually transmitted disease (STD) testing.  Diabetes screening. This is done by checking your blood sugar (glucose) after you have not eaten for a while (fasting). You may have this done every 1-3 years.  Bone density scan. This is done to screen for osteoporosis. You may have this done starting at age 26.  Mammogram. This may be done every 1-2 years. Talk to your health care provider about how often you should have regular mammograms. Talk with your health care provider about your test results, treatment options, and if necessary, the need for more tests. Vaccines  Your health care provider may recommend certain vaccines, such as:  Influenza  vaccine. This is recommended every year.  Tetanus, diphtheria, and acellular pertussis (Tdap, Td) vaccine. You may need a Td booster every 10 years.  Zoster vaccine. You may need this after age 51.  Pneumococcal 13-valent conjugate  (PCV13) vaccine. One dose is recommended after age 66.  Pneumococcal polysaccharide (PPSV23) vaccine. One dose is recommended after age 59. Talk to your health care provider about which screenings and vaccines you need and how often you need them. This information is not intended to replace advice given to you by your health care provider. Make sure you discuss any questions you have with your health care provider. Document Released: 03/19/2015 Document Revised: 11/10/2015 Document Reviewed: 12/22/2014 Elsevier Interactive Patient Education  2017 [place] Prevention in the Home Falls can cause injuries. They can happen to people of all ages. There are many things you can do to make your home safe and to help prevent falls. What can I do on the outside of my home?  Regularly fix the edges of walkways and driveways and fix any cracks.  Remove anything that might make you trip as you walk through a door, such as a raised step or threshold.  Trim any bushes or trees on the path to your home.  Use bright outdoor lighting.  Clear any walking paths of anything that might make someone trip, such as rocks or tools.  Regularly check to see if handrails are loose or broken. Make sure that both sides of any steps have handrails.  Any raised decks and porches should have guardrails on the edges.  Have any leaves, snow, or ice cleared regularly.  Use sand or salt on walking paths during winter.  Clean up any spills in your garage right away. This includes oil or grease spills. What can I do in the bathroom?  Use night lights.  Install grab bars by the toilet and in the tub and shower. Do not use towel bars as grab bars.  Use non-skid mats or decals in the tub or shower.  If you need to sit down in the shower, use a plastic, non-slip stool.  Keep the floor dry. Clean up any water that spills on the floor as soon as it happens.  Remove soap buildup in the tub or shower  regularly.  Attach bath mats securely with double-sided non-slip rug tape.  Do not have throw rugs and other things on the floor that can make you trip. What can I do in the bedroom?  Use night lights.  Make sure that you have a light by your bed that is easy to reach.  Do not use any sheets or blankets that are too big for your bed. They should not hang down onto the floor.  Have a firm chair that has side arms. You can use this for support while you get dressed.  Do not have throw rugs and other things on the floor that can make you trip. What can I do in the kitchen?  Clean up any spills right away.  Avoid walking on wet floors.  Keep items that you use a lot in easy-to-reach places.  If you need to reach something above you, use a strong step stool that has a grab bar.  Keep electrical cords out of the way.  Do not use floor polish or wax that makes floors slippery. If you must use wax, use non-skid floor wax.  Do not have throw rugs and other things on the floor that can  make you trip. What can I do with my stairs?  Do not leave any items on the stairs.  Make sure that there are handrails on both sides of the stairs and use them. Fix handrails that are broken or loose. Make sure that handrails are as long as the stairways.  Check any carpeting to make sure that it is firmly attached to the stairs. Fix any carpet that is loose or worn.  Avoid having throw rugs at the top or bottom of the stairs. If you do have throw rugs, attach them to the floor with carpet tape.  Make sure that you have a light switch at the top of the stairs and the bottom of the stairs. If you do not have them, ask someone to add them for you. What else can I do to help prevent falls?  Wear shoes that:  Do not have high heels.  Have rubber bottoms.  Are comfortable and fit you well.  Are closed at the toe. Do not wear sandals.  If you use a stepladder:  Make sure that it is fully  opened. Do not climb a closed stepladder.  Make sure that both sides of the stepladder are locked into place.  Ask someone to hold it for you, if possible.  Clearly mark and make sure that you can see:  Any grab bars or handrails.  First and last steps.  Where the edge of each step is.  Use tools that help you move around (mobility aids) if they are needed. These include:  Canes.  Walkers.  Scooters.  Crutches.  Turn on the lights when you go into a dark area. Replace any light bulbs as soon as they burn out.  Set up your furniture so you have a clear path. Avoid moving your furniture around.  If any of your floors are uneven, fix them.  If there are any pets around you, be aware of where they are.  Review your medicines with your doctor. Some medicines can make you feel dizzy. This can increase your chance of falling. Ask your doctor what other things that you can do to help prevent falls. This information is not intended to replace advice given to you by your health care provider. Make sure you discuss any questions you have with your health care provider. Document Released: 12/17/2008 Document Revised: 07/29/2015 Document Reviewed: 03/27/2014 Elsevier Interactive Patient Education  2017 [employer].

## 2019-06-16 NOTE — Progress Notes (Signed)
Subjective:   Jessica Mcgee is a 69 y.o. female who presents for an Initial Medicare Annual Wellness Visit.  Virtual Visit via Telephone Note  I connected with Jessica Mcgee on 06/16/19 at  3:20 PM EDT by telephone and verified that I am speaking with the correct person using two identifiers.  Medicare Annual Wellness visit completed telephonically due to Covid-19 pandemic.   Location: Patient: home Provider: office   I discussed the limitations, risks, security and privacy concerns of performing an evaluation and management service by telephone and the availability of in person appointments. The patient expressed understanding and agreed to proceed.  Some vital signs may be absent or patient reported.   Jessica Marker, LPN    Review of Systems            Objective:    Today's Vitals   06/16/19 1527  PainSc: 4    There is no height or weight on file to calculate BMI.  Advanced Directives 06/16/2019  Does Patient Have a Medical Advance Directive? Yes  Type of Paramedic of [place];Living will  Copy of [place] in Chart? No - copy requested    Current Medications (verified) Outpatient Encounter Medications as of 06/16/2019  Medication Sig  . Cholecalciferol (VITAMIN D-1000 MAX ST) 25 MCG (1000 UT) tablet Take 1,000 Units by mouth daily.  . famotidine (PEPCID) 10 MG tablet Take 10 mg by mouth daily as needed for heartburn or indigestion.  . fluticasone (FLONASE) 50 MCG/ACT nasal spray Place 2 sprays into both nostrils daily.  . hydrochlorothiazide (HYDRODIURIL) 25 MG tablet Take 1 tablet (25 mg total) by mouth daily. For further refills need office visit.  Marland Kitchen HYDROcodone-acetaminophen (NORCO/VICODIN) 5-325 MG tablet Take 1 tablet by mouth daily as needed.  . Multiple Vitamins-Minerals (CENTRUM ADULTS) TABS Take by mouth.  . potassium chloride (KLOR-CON) 10 MEQ tablet Take 1 tablet (10 mEq total) by mouth daily.   No  facility-administered encounter medications on file as of 06/16/2019.    Allergies (verified) Cymbalta [duloxetine hcl] and Latex   History: Past Medical History:  Diagnosis Date  . Bell's palsy   . DDD (degenerative disc disease), lumbar   . Fatigue   . Fibromyalgia   . Hemangioma of intra-abdominal structures   . History of Lyme disease    Past Surgical History:  Procedure Laterality Date  . TUBAL LIGATION     Family History  Problem Relation Age of Onset  . Hypertension Mother   . Osteoporosis Mother   . Cancer Mother        breast  . Breast cancer Mother        ~5  . Hypertension Father   . Heart attack Father   . Stroke Father   . Kidney disease Maternal Grandmother   . Osteoporosis Maternal Grandmother   . Kidney disease Maternal Grandfather   . Cancer Maternal Grandfather   . Kidney disease Paternal Grandmother   . Cancer Paternal Grandmother   . Kidney disease Paternal Grandfather   . Breast cancer Paternal Aunt        3 pat aunts   Social History   Socioeconomic History  . Marital status: Married    Spouse name: Not on file  . Number of children: 0  . Years of education: Not on file  . Highest education level: Not on file  Occupational History  . Not on file  Tobacco Use  . Smoking status: Never Smoker  .  Smokeless tobacco: Never Used  Substance and Sexual Activity  . Alcohol use: Yes    Alcohol/week: 0.0 standard drinks    Comment: occassional  . Drug use: No  . Sexual activity: Yes  Other Topics Concern  . Not on file  Social History Narrative  . Not on file   Social Determinants of Health   Financial Resource Strain: Low Risk   . Difficulty of Paying Living Expenses: Not hard at all  Food Insecurity: No Food Insecurity  . Worried About Charity fundraiser in the Last Year: Never true  . Ran Out of Food in the Last Year: Never true  Transportation Needs: No Transportation Needs  . Lack of Transportation (Medical): No  . Lack of  Transportation (Non-Medical): No  Physical Activity: Inactive  . Days of Exercise per Week: 0 days  . Minutes of Exercise per Session: 0 min  Stress: Stress Concern Present  . Feeling of Stress : Very much  Social Connections: Unknown  . Frequency of Communication with Friends and Family: Patient refused  . Frequency of Social Gatherings with Friends and Family: Patient refused  . Attends Religious Services: Patient refused  . Active Member of Clubs or Organizations: Patient refused  . Attends Archivist Meetings: Patient refused  . Marital Status: Married    Tobacco Counseling Counseling given: Not Answered   Clinical Intake:  Pre-visit preparation completed: Yes  Pain Score: 4      Nutritional Risks: None Diabetes: No  How often do you need to have someone help you when you read instructions, pamphlets, or other written materials from your doctor or pharmacy?: 1 - Never  Interpreter Needed?: No  Information entered by :: Jessica Marker LPN   Activities of Daily Living In your present state of health, do you have any difficulty performing the following activities: 03/31/2019  Hearing? N  Vision? N  Difficulty concentrating or making decisions? N  Walking or climbing stairs? N  Dressing or bathing? N  Doing errands, shopping? N  Some recent data might be hidden     Immunizations and Health Maintenance Immunization History  Administered Date(s) Administered  . PFIZER SARS-COV-2 Vaccination 06/06/2019  . Td 01/28/2014   There are no preventive care reminders to display for this patient.  Patient Care Team: Glean Hess, MD as PCP - General (Internal Medicine) Fredirick Maudlin (Pain Medicine) [name], MD as Consulting Physician (Gastroenterology)  Indicate any recent Medical Services you may have received from other than [facility] providers in the past year (date may be approximate).     Assessment:   This is a routine wellness examination for  Jessica Mcgee.  Hearing/Vision screen  Hearing Screening   125Hz  250Hz  500Hz  1000Hz  2000Hz  3000Hz  4000Hz  6000Hz  8000Hz   Right ear:           Left ear:           Comments: Pt denies hearing difficulty  Vision Screening Comments: Vision screenings done at [place] issues and exercise activities discussed:    Goals   None    Depression Screen PHQ 2/9 Scores 06/16/2019 05/07/2019 03/31/2019 01/28/2018 07/04/2017 10/23/2016  PHQ - 2 Score 2 2 4  0 0 0  PHQ- 9 Score 9 7 4 10  - -    Fall Risk Fall Risk  03/31/2019 01/28/2018 07/04/2017 10/23/2016 04/19/2016  Falls in the past year? 0 0 No No No  Number falls in past yr: 0 - - - -  Injury with Fall? 0 - - - -  Follow up Falls evaluation completed Falls evaluation completed - - -   FALL RISK PREVENTION PERTAINING TO THE HOME:  Any stairs in or around the home? Yes  If so, do they handrails? Yes   Home free of loose throw rugs in walkways, pet beds, electrical cords, etc? Yes  Adequate lighting in your home to reduce risk of falls? Yes   ASSISTIVE DEVICES UTILIZED TO PREVENT FALLS:  Life alert? No  Use of a cane, walker or w/c? No  Grab bars in the bathroom? Yes  Shower chair or bench in shower? Yes  Elevated toilet seat or a handicapped toilet? No   DME ORDERS:  DME order needed?  No   TIMED UP AND GO:  Was the test performed? No . Telephonic visit.   Education: Fall risk prevention has been discussed.  Intervention(s) required? No   Cognitive Function:        Screening Tests Health Maintenance  Topic Date Due  . COLONOSCOPY  07/26/2019 (Originally 05/02/2017)  . PNA vac Low Risk Adult (1 of 2 - PCV13) 03/30/2020 (Originally 09/17/2017)  . MAMMOGRAM  11/04/2020  . TETANUS/TDAP  01/29/2024  . DEXA SCAN  Completed  . Hepatitis C Screening  Completed    Qualifies for Shingles Vaccine? Yes . Due for Shingrix. Education has been provided regarding the importance of this vaccine. Pt has been advised to call  insurance company to determine out of pocket expense. Advised may also receive vaccine at local pharmacy or Health Dept. Verbalized acceptance and understanding.  Tdap: Up to date  Flu Vaccine: Due for Flu vaccine. Does the patient want to receive this vaccine today?  No . Education has been provided regarding the importance of this vaccine but still declined. Advised may receive this vaccine at local pharmacy or Health Dept. Aware to provide a copy of the vaccination record if obtained from local pharmacy or Health Dept. Verbalized acceptance and understanding.  Pneumococcal Vaccine: Due for Pneumococcal vaccine. Does the patient want to receive this vaccine today?  No . Education has been provided regarding the importance of this vaccine but still declined. Advised may receive this vaccine at local pharmacy or Health Dept. Aware to provide a copy of the vaccination record if obtained from local pharmacy or Health Dept. Verbalized acceptance and understanding.   Cancer Screenings:  Colorectal Screening: Completed 05/02/12. Repeat every 5 years; Pt requests to postpone until 2022.  Mammogram: Completed 11/05/18. Repeat every year;    Bone Density: Completed 11/05/18. Results reflect NORMAL. Repeat every 2 years.   Lung Cancer Screening: (Low Dose CT Chest recommended if Age 11-80 years, 30 pack-year currently smoking OR have quit w/in 15years.) does not qualify.   Additional Screening:  Hepatitis C Screening: does qualify; Completed 10/07/15  Vision Screening: Recommended annual ophthalmology exams for early detection of glaucoma and other disorders of the eye. Is the patient up to date with their annual eye exam?  No  Who is the provider or what is the name of the office in which the pt attends annual eye exams? [place] Screening: Recommended annual dental exams for proper oral hygiene  Community Resource Referral:  CRR required this visit?  No      Plan:    I have  personally reviewed and addressed the Medicare Annual Wellness questionnaire and have noted the following in the patient's chart:  A. Medical and social history B. Use of alcohol,  tobacco or illicit drugs  C. Current medications and supplements D. Functional ability and status E.  Nutritional status F.  Physical activity G. Advance directives H. List of other physicians I.  Hospitalizations, surgeries, and ER visits in previous 12 months J.  [place] such as hearing and vision if needed, cognitive and depression L. Referrals and appointments   In addition, I have reviewed and discussed with patient certain preventive protocols, quality metrics, and best practice recommendations. A written personalized care plan for preventive services as well as general preventive health recommendations were provided to patient.   Signed,  Jessica Marker, LPN Nurse Health Advisor   Nurse Notes: pt c/o stress due to sister with breast cancer, husband with prostate cancer and dog with lymphoma. She also has a lot of stress with work due to large, out of state event coming up. Offered referral to [facility] team for caregiver support and resources. Pt declined at this time but aware available if needed.

## 2019-07-01 ENCOUNTER — Ambulatory Visit: Payer: PPO

## 2019-07-03 ENCOUNTER — Ambulatory Visit
Admission: RE | Admit: 2019-07-03 | Discharge: 2019-07-03 | Disposition: A | Discharge status: Home / Self Care | Payer: PPO | Source: Ambulatory Visit | Attending: Family Medicine | Admitting: Family Medicine

## 2019-07-03 ENCOUNTER — Other Ambulatory Visit: Payer: Self-pay

## 2019-07-03 ENCOUNTER — Ambulatory Visit
Admission: EM | Admit: 2019-07-03 | Discharge: 2019-07-03 | Disposition: A | Discharge status: Home / Self Care | Payer: PPO | Attending: Family Medicine | Admitting: Family Medicine

## 2019-07-03 DIAGNOSIS — R600 Localized edema: Secondary | ICD-10-CM | POA: Diagnosis not present

## 2019-07-03 DIAGNOSIS — M7989 Other specified soft tissue disorders: Secondary | ICD-10-CM | POA: Insufficient documentation

## 2019-07-03 NOTE — ED Provider Notes (Signed)
[facility]    CSN: [id number] Arrival date & time: 07/03/19  1836     History   Chief Complaint Chief Complaint  Patient presents with  . Leg Swelling    HPI  69 year old female presents with the above complaint.  Patient reports right leg swelling which started yesterday.  She states that she has elevated area and has not had any improvement.  Denies fall, trauma, injury.  She began to be concerned when she elevated the leg and did not improve.  She has no swelling in the left lower extremity.  No known exacerbating relieving factors.  No shortness of breath.  No reports of rash.  No changes in her medications.  No other complaints.  Past Medical History:  Diagnosis Date  . Bell's palsy   . DDD (degenerative disc disease), lumbar   . Fatigue   . Fibromyalgia   . Hemangioma of intra-abdominal structures   . History of Lyme disease     Patient Active Problem List   Diagnosis Date Noted  . Hyperlipidemia, mixed 05/08/2019  . History of Lyme disease 03/31/2019  . Knee pain 05/22/2018  . Osteoarthritis of knee 02/05/2018  . Osteoarthritis of both knees 07/04/2017  . 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 GERD (gastroesophageal reflux disease) 10/07/2015  . Essential hypertension 02/10/2015  . Fibromyalgia 08/25/2014    Past Surgical History:  Procedure Laterality Date  . TUBAL LIGATION      OB History   No obstetric history on file.      Home Medications    Prior to Admission medications   Medication Sig Start Date End Date Taking? Authorizing Provider  Cholecalciferol (VITAMIN D-1000 MAX ST) 25 MCG (1000 UT) tablet Take 1,000 Units by mouth daily.    [provider]  famotidine (PEPCID) 10 MG tablet Take 10 mg by mouth daily as needed for heartburn or indigestion.    [provider]  fluticasone (FLONASE) 50 MCG/ACT nasal spray Place 2 sprays into both nostrils daily. 05/09/17   Coral Spikes, DO   hydrochlorothiazide (HYDRODIURIL) 25 MG tablet Take 1 tablet (25 mg total) by mouth daily. For further refills need office visit. 03/31/19   Glean Hess, MD  HYDROcodone-acetaminophen (NORCO/VICODIN) 5-325 MG tablet Take 1 tablet by mouth daily as needed. 06/05/19   [provider]  Multiple Vitamins-Minerals (CENTRUM ADULTS) TABS Take by mouth.    [provider]  potassium chloride (KLOR-CON) 10 MEQ tablet Take 1 tablet (10 mEq total) by mouth daily. 03/31/19   Glean Hess, MD    Family History Family History  Problem Relation Age of Onset  . Hypertension Mother   . Osteoporosis Mother   . Cancer Mother        breast  . Breast cancer Mother        ~6  . Hypertension Father   . Heart attack Father   . Stroke Father   . Kidney disease Maternal Grandmother   . Osteoporosis Maternal Grandmother   . Kidney disease Maternal Grandfather   . Cancer Maternal Grandfather   . Kidney disease Paternal Grandmother   . Cancer Paternal Grandmother   . Kidney disease Paternal Grandfather   . Breast cancer Paternal Aunt        3 pat aunts    Social History Social History   Tobacco Use  . Smoking status: Never Smoker  . Smokeless tobacco: Never Used  Substance Use Topics  . Alcohol use: Yes  Alcohol/week: 0.0 standard drinks    Comment: occassional  . Drug use: No     Allergies   Cymbalta [duloxetine hcl] and Latex   Review of Systems Review of Systems  Constitutional: Negative.   Cardiovascular: Positive for leg swelling.   Physical Exam Triage Vital Signs ED Triage Vitals  Enc Vitals Group     BP 07/03/19 1934 (!) 168/78     Pulse Rate 07/03/19 1934 77     Resp 07/03/19 1934 16     Temp 07/03/19 1934 98 F (36.7 C)     Temp Source 07/03/19 1934 Oral     SpO2 --      Weight 07/03/19 1938 189 lb (85.7 kg)     Height 07/03/19 1938 5\' 2"  (1.575 m)     Head Circumference --      Peak Flow --      Pain Score 07/03/19 1938 8     Pain Loc --       Pain Edu? --      Excl. in [place]? --    Updated Vital Signs BP (!) 168/78 (BP Location: Left Arm)   Pulse 77   Temp 98 F (36.7 C) (Oral)   Resp 16   Ht 5\' 2"  (1.575 m)   Wt 85.7 kg   BMI 34.57 kg/m   Visual Acuity Right Eye Distance:   Left Eye Distance:   Bilateral Distance:    Right Eye Near:   Left Eye Near:    Bilateral Near:     Physical Exam Constitutional:      General: She is not in acute distress.    Appearance: Normal appearance. She is obese. She is not ill-appearing.  HENT:     Head: Normocephalic and atraumatic.  Eyes:     General:        Right eye: No discharge.        Left eye: No discharge.     Conjunctiva/sclera: Conjunctivae normal.  Cardiovascular:     Rate and Rhythm: Normal rate and regular rhythm.  Pulmonary:     Effort: Pulmonary effort is normal.     Breath sounds: Normal breath sounds.  Musculoskeletal:     Comments: Marked swelling noted of the right lower extremity from the knee down.  Negative Homans' sign.  No appreciable calf tenderness or erythema.  Some varicose veins noted.  Neurological:     Mental Status: She is alert.  Psychiatric:        Mood and Affect: Mood normal.        Behavior: Behavior normal.    UC Treatments / Results  Labs (all labs ordered are listed, but only abnormal results are displayed) Labs Reviewed - No data to display  EKG   Radiology US Venous Img Lower Unilateral Right (DVT)  Result Date: 07/03/2019 CLINICAL DATA:  Right lower extremity leg swelling for 1 day EXAM: RIGHT LOWER EXTREMITY VENOUS DOPPLER ULTRASOUND TECHNIQUE: Gray-scale sonography with graded compression, as well as color Doppler and duplex ultrasound were performed to evaluate the lower extremity deep venous systems from the level of the common femoral vein and including the common femoral, femoral, profunda femoral, popliteal and calf veins including the posterior tibial, peroneal and gastrocnemius veins when visible. The  superficial great saphenous vein was also interrogated. Spectral Doppler was utilized to evaluate flow at rest and with distal augmentation maneuvers in the common femoral, femoral and popliteal veins. COMPARISON:  None. FINDINGS: Contralateral Common Femoral Vein: Respiratory phasicity is  normal and symmetric with the symptomatic side. No evidence of thrombus. Normal compressibility. Common Femoral Vein: No evidence of thrombus. Normal compressibility, respiratory phasicity and response to augmentation. Saphenofemoral Junction: No evidence of thrombus. Normal compressibility and flow on color Doppler imaging. Profunda Femoral Vein: No evidence of thrombus. Normal compressibility and flow on color Doppler imaging. Femoral Vein: No evidence of thrombus. Normal compressibility, respiratory phasicity and response to augmentation. Popliteal Vein: No evidence of thrombus. Normal compressibility, respiratory phasicity and response to augmentation. Calf Veins: No evidence of thrombus. Normal compressibility and flow on color Doppler imaging. Superficial Great Saphenous Vein: No evidence of thrombus. Normal compressibility. Venous Reflux:  None. Other Findings:  None. IMPRESSION: No evidence of deep venous thrombosis. Electronically Signed   By: Inez Catalina M.D.   On: 07/03/2019 22:20    Procedures Procedures (including critical care time)  Medications Ordered in UC Medications - No data to display  Initial Impression / Assessment and Plan / UC Course  I have reviewed the triage vital signs and the nursing notes.  Pertinent labs & imaging results that were available during my care of the patient were reviewed by me and considered in my medical decision making (see chart for details).    69 year old female presents with right lower extremity edema.  Concern for DVT.  Lower extremity Doppler was obtained and was negative.  Advised rest and elevation.  Follow-up with primary care physician.  Supportive care.   Final Clinical Impressions(s) / UC Diagnoses   Final diagnoses:  Edema of right lower leg     Discharge Instructions     I will call with the results.  Go to the ER at [facility] to check in for outpatient [place].  Take care  Dr. Lacinda Axon    ED Prescriptions    None     PDMP not reviewed this encounter.   Coral Spikes, [place] 07/03/19 2339

## 2019-07-03 NOTE — Discharge Instructions (Signed)
I will call with the results.  Go to the ER at [facility] to check in for outpatient [place].  Take care  Dr. Lacinda Axon

## 2019-07-03 NOTE — ED Triage Notes (Signed)
Pt states right foot swelling started yesterday. Now swelling is moving up lower leg and starting to feel tight. No injury.

## 2019-07-14 ENCOUNTER — Ambulatory Visit: Payer: Self-pay

## 2019-07-14 NOTE — Telephone Encounter (Signed)
Called and spoke with pt. Informed Dr. Army Melia said she should get the 2nd dose if she wants to be fully protected. Told her headaches, fever, chills, etc can all be normal side effects to the covid vaccine.  She verbalized understanding of this. Said from her lst visit her foot is still swelling but much better than it was.  CM

## 2019-07-14 NOTE — Telephone Encounter (Signed)
Pt. Reports she had a bad reaction to first COVID 19 vaccine. After the first 12 hours she developed "auras and had a horrible headache for 12 hours." States she has a history of Lyme's Disease and she was told at some point to be careful of vaccinations because they "could trigger a Lyme's event." Is afraid to get the second vaccine this Friday. Would like Dr. Gaspar Cola input.  Answer Assessment - Initial Assessment Questions 1. SYMPTOMS: "What is the main symptom?" (e.g., redness, swelling, pain)      Had a bad headache to COVID 19 vaccine 2. ONSET: "When was the vaccine (shot) given?" "How much later did the headache begin?" (e.g., hours, days ago)      12 hours 3. SEVERITY: "How bad is it?"      Severe 4. FEVER: "Is there a fever?" If so, ask: "What is it, how was it measured, and when did it start?"      No 5. IMMUNIZATIONS GIVEN: "What shots have you recently received?"     COVID 19 6. PAST REACTIONS: "Have you reacted to immunizations before?" If so, ask: "What happened?"     No 7. OTHER SYMPTOMS: "Do you have any other symptoms?"     Leg swelling right  Protocols used: IMMUNIZATION REACTIONS-A-AH

## 2019-07-15 ENCOUNTER — Ambulatory Visit: Payer: PPO

## 2019-07-18 ENCOUNTER — Other Ambulatory Visit: Payer: Self-pay

## 2019-07-18 ENCOUNTER — Ambulatory Visit: Payer: PPO | Attending: Internal Medicine

## 2019-07-18 DIAGNOSIS — Z23 Encounter for immunization: Secondary | ICD-10-CM

## 2019-07-18 NOTE — Progress Notes (Signed)
   Covid-19 Vaccination Clinic  Name:  Jessica Mcgee    MRN: [id number] DOB: 05-May-1950  07/18/2019  Jessica Mcgee was observed post Covid-19 immunization for 15 minutes without incident. She was provided with Vaccine Information Sheet and instruction to access the V-Safe system.   Jessica Mcgee was instructed to call 911 with any severe reactions post vaccine: Marland Kitchen Difficulty breathing  . Swelling of face and throat  . A fast heartbeat  . A bad rash all over body  . Dizziness and weakness   Immunizations Administered    Name Date Dose VIS Date Route   Pfizer COVID-19 Vaccine 07/18/2019 11:45 AM 0.3 mL 04/30/2018 Intramuscular   Manufacturer: [place]   Lot: [id number]   [place]: [id number]

## 2019-08-20 DIAGNOSIS — M25561 Pain in right knee: Secondary | ICD-10-CM | POA: Diagnosis not present

## 2019-08-20 DIAGNOSIS — M170 Bilateral primary osteoarthritis of knee: Secondary | ICD-10-CM | POA: Diagnosis not present

## 2019-08-20 DIAGNOSIS — M25562 Pain in left knee: Secondary | ICD-10-CM | POA: Diagnosis not present

## 2019-08-20 DIAGNOSIS — G894 Chronic pain syndrome: Secondary | ICD-10-CM | POA: Diagnosis not present

## 2019-09-10 ENCOUNTER — Other Ambulatory Visit: Payer: Self-pay | Admitting: Internal Medicine

## 2019-09-10 DIAGNOSIS — I10 Essential (primary) hypertension: Secondary | ICD-10-CM

## 2019-09-17 DIAGNOSIS — M25562 Pain in left knee: Secondary | ICD-10-CM | POA: Diagnosis not present

## 2019-09-17 DIAGNOSIS — G894 Chronic pain syndrome: Secondary | ICD-10-CM | POA: Diagnosis not present

## 2019-09-17 DIAGNOSIS — M25561 Pain in right knee: Secondary | ICD-10-CM | POA: Diagnosis not present

## 2019-09-17 DIAGNOSIS — M170 Bilateral primary osteoarthritis of knee: Secondary | ICD-10-CM | POA: Diagnosis not present

## 2019-10-08 ENCOUNTER — Other Ambulatory Visit: Payer: Self-pay | Admitting: Internal Medicine

## 2019-10-08 DIAGNOSIS — Z1231 Encounter for screening mammogram for malignant neoplasm of breast: Secondary | ICD-10-CM

## 2019-10-24 ENCOUNTER — Ambulatory Visit (INDEPENDENT_AMBULATORY_CARE_PROVIDER_SITE_OTHER): Payer: PPO | Admitting: Internal Medicine

## 2019-10-24 ENCOUNTER — Encounter: Payer: Self-pay | Admitting: Internal Medicine

## 2019-10-24 ENCOUNTER — Other Ambulatory Visit: Payer: Self-pay

## 2019-10-24 VITALS — BP 130/74 | HR 75 | Temp 98.9°F | Ht 61.5 in | Wt 194.0 lb

## 2019-10-24 DIAGNOSIS — M797 Fibromyalgia: Secondary | ICD-10-CM

## 2019-10-24 DIAGNOSIS — I10 Essential (primary) hypertension: Secondary | ICD-10-CM | POA: Diagnosis not present

## 2019-10-24 NOTE — Progress Notes (Signed)
Date:  10/24/2019   Name:  Jessica Mcgee [facility]   DOB:  Mar 23, 1950   MRN:  [id number]   Chief Complaint: Hypertension (follow up )  Hypertension This is a chronic problem. The problem is controlled. Pertinent negatives include no chest pain, headaches, palpitations or shortness of breath. Past treatments include diuretics. The current treatment provides significant improvement. There are no compliance problems.    Immunization History  Administered Date(s) Administered  . PFIZER SARS-COV-2 Vaccination 06/06/2019, 07/18/2019  . Td 01/28/2014    Lab Results  Component Value Date   CREATININE 0.80 05/07/2019   BUN 10 05/07/2019   NA 140 05/07/2019   K 3.6 05/07/2019   CL 99 05/07/2019   CO2 25 05/07/2019   Lab Results  Component Value Date   CHOL 236 (H) 05/07/2019   HDL 72 05/07/2019   LDLCALC 131 (H) 05/07/2019   TRIG 189 (H) 05/07/2019   CHOLHDL 3.3 05/07/2019   Lab Results  Component Value Date   TSH 2.780 05/07/2019   No results found for: HGBA1C Lab Results  Component Value Date   WBC 7.9 05/07/2019   HGB 14.3 05/07/2019   HCT 41.8 05/07/2019   MCV 89 05/07/2019   PLT 291 05/07/2019   Lab Results  Component Value Date   ALT 22 05/07/2019   AST 22 05/07/2019   ALKPHOS 52 05/07/2019   BILITOT 0.3 05/07/2019     Review of Systems  Constitutional: Negative for appetite change, fatigue, fever and unexpected weight change.  HENT: Negative for tinnitus and trouble swallowing.   Eyes: Negative for visual disturbance.  Respiratory: Negative for cough, chest tightness and shortness of breath.   Cardiovascular: Negative for chest pain, palpitations and leg swelling.  Gastrointestinal: Negative for abdominal pain.  Endocrine: Negative for polydipsia and polyuria.  Genitourinary: Negative for dysuria and hematuria.  Musculoskeletal: Positive for arthralgias.  Neurological: Negative for tremors, numbness and headaches.  Psychiatric/Behavioral: Negative for  dysphoric mood.     Patient Active Problem List   Diagnosis Date Noted  . Hyperlipidemia, mixed 05/08/2019  . History of Lyme disease 03/31/2019  . Knee pain 05/22/2018  . Osteoarthritis of knee 02/05/2018  . Osteoarthritis of both knees 07/04/2017  . 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 GERD (gastroesophageal reflux disease) 10/07/2015  . Essential hypertension 02/10/2015  . Fibromyalgia 08/25/2014    Allergies  Allergen Reactions  . Cymbalta [Duloxetine Hcl]     Marked fatigue  . Latex     Past Surgical History:  Procedure Laterality Date  . TUBAL LIGATION      Social History   Tobacco Use  . Smoking status: Never Smoker  . Smokeless tobacco: Never Used  Vaping Use  . Vaping Use: Never used  Substance Use Topics  . Alcohol use: Yes    Alcohol/week: 0.0 standard drinks    Comment: occassional  . Drug use: No     Medication list has been reviewed and updated.  Current Meds  Medication Sig  . Cholecalciferol (VITAMIN D-1000 MAX ST) 25 MCG (1000 UT) tablet Take 1,000 Units by mouth daily.  . famotidine (PEPCID) 10 MG tablet Take 10 mg by mouth daily as needed for heartburn or indigestion.  . fluticasone (FLONASE) 50 MCG/ACT nasal spray Place 2 sprays into both nostrils daily.  . hydrochlorothiazide (HYDRODIURIL) 25 MG tablet TAKE 1 TABLET (25 MG TOTAL) BY MOUTH DAILY. FOR FURTHER REFILLS NEED OFFICE VISIT.  Marland Kitchen HYDROcodone-acetaminophen (NORCO/VICODIN) 5-325 MG tablet Take 1 tablet by  mouth daily as needed.  . Milk Thistle 175 MG CAPS Take 1 capsule by mouth in the morning and at bedtime.  . Multiple Vitamins-Minerals (CENTRUM ADULTS) TABS Take by mouth.  . potassium chloride (KLOR-CON) 10 MEQ tablet TAKE 1 TABLET BY MOUTH EVERY DAY    PHQ 2/9 Scores 06/16/2019 05/07/2019 03/31/2019 01/28/2018  PHQ - 2 Score 2 2 4  0  PHQ- 9 Score 9 7 4 10     GAD 7 : Generalized Anxiety Score 01/28/2018  Nervous, Anxious, on Edge 0  Control/stop worrying 0    Worry too much - different things 0  Trouble relaxing 0  Restless 0  Easily annoyed or irritable 0  Afraid - awful might happen 0  Total GAD 7 Score 0    BP Readings from Last 3 Encounters:  10/24/19 130/74  07/03/19 (!) 168/78  05/07/19 132/70    Physical Exam Vitals and nursing note reviewed.  Constitutional:      General: She is not in acute distress.    Appearance: She is well-developed.  HENT:     Head: Normocephalic and atraumatic.  Cardiovascular:     Rate and Rhythm: Normal rate and regular rhythm.  Pulmonary:     Effort: Pulmonary effort is normal. No respiratory distress.     Breath sounds: No wheezing or rhonchi.  Musculoskeletal:     Cervical back: Normal range of motion.     Right lower leg: No edema.     Left lower leg: No edema.  Lymphadenopathy:     Cervical: No cervical adenopathy.  Skin:    General: Skin is warm and dry.     Findings: No rash.  Neurological:     General: No focal deficit present.     Mental Status: She is alert and oriented to person, place, and time.  Psychiatric:        Mood and Affect: Mood normal.     Wt Readings from Last 3 Encounters:  10/24/19 194 lb (88 kg)  07/03/19 189 lb (85.7 kg)  05/07/19 194 lb (88 kg)    BP 130/74   Pulse 75   Temp 98.9 F (37.2 C) (Oral)   Ht 5' 1.5" (1.562 m)   Wt 194 lb (88 kg)   SpO2 95%   BMI 36.06 kg/m   Assessment and Plan: 1. Essential hypertension Clinically stable exam with well controlled BP on hctz alone along with diet and weight loss. Tolerating medications without side effects at this time. Pt to continue current regimen and low sodium diet; benefits of regular exercise as able discussed.  2. Fibromyalgia Now seeing a specialist for myofacial release which has helped tremendously.  It may well be that much of her pain is not due to fibromylagia.   Partially dictated using Editor, commissioning. Any errors are unintentional.  Halina Maidens, MD [facility]  10/24/2019

## 2019-11-04 DIAGNOSIS — H43813 Vitreous degeneration, bilateral: Secondary | ICD-10-CM | POA: Diagnosis not present

## 2019-11-24 ENCOUNTER — Ambulatory Visit
Admission: RE | Admit: 2019-11-24 | Discharge: 2019-11-24 | Disposition: A | Discharge status: Home / Self Care | Payer: PPO | Source: Ambulatory Visit | Attending: Internal Medicine | Admitting: Internal Medicine

## 2019-11-24 ENCOUNTER — Other Ambulatory Visit: Payer: Self-pay

## 2019-11-24 DIAGNOSIS — Z1231 Encounter for screening mammogram for malignant neoplasm of breast: Secondary | ICD-10-CM | POA: Insufficient documentation

## 2019-12-07 ENCOUNTER — Other Ambulatory Visit: Payer: Self-pay | Admitting: Internal Medicine

## 2019-12-07 DIAGNOSIS — I10 Essential (primary) hypertension: Secondary | ICD-10-CM

## 2019-12-07 NOTE — Telephone Encounter (Signed)
Requested Prescriptions  Pending Prescriptions Disp Refills   hydrochlorothiazide (HYDRODIURIL) 25 MG tablet [Pharmacy Med Name: HYDROCHLOROTHIAZIDE 25 MG TAB] 90 tablet 1    Sig: TAKE 1 TABLET (25 MG TOTAL) BY MOUTH DAILY. FOR FURTHER REFILLS NEED OFFICE VISIT.     Cardiovascular: Diuretics - Thiazide Passed - 12/07/2019  4:25 AM      Passed - Ca in normal range and within 360 days    Calcium  Date Value Ref Range Status  05/07/2019 9.9 8.7 - 10.3 mg/dL Final         Passed - Cr in normal range and within 360 days    Creatinine, Ser  Date Value Ref Range Status  05/07/2019 0.80 0.57 - 1.00 mg/dL Final         Passed - K in normal range and within 360 days    Potassium  Date Value Ref Range Status  05/07/2019 3.6 3.5 - 5.2 mmol/L Final         Passed - Na in normal range and within 360 days    Sodium  Date Value Ref Range Status  05/07/2019 140 134 - 144 mmol/L Final         Passed - Last BP in normal range    BP Readings from Last 1 Encounters:  10/24/19 130/74         Passed - Valid encounter within last 6 months    Recent Outpatient Visits          1 month ago Essential hypertension   [facility] Glean Hess, MD   7 months ago Annual physical exam   [facility] Glean Hess, MD   8 months ago Gastroesophageal reflux disease, unspecified whether esophagitis present   [facility] Glean Hess, [facility]   1 year ago Dysuria   [facility], [place]   1 year ago Acute pain of left shoulder   [facility], [place]      Future Appointments            In 5 months Army Melia, Jesse Sans, MD [facility], [place]

## 2019-12-10 ENCOUNTER — Other Ambulatory Visit: Payer: Self-pay | Admitting: Internal Medicine

## 2019-12-10 DIAGNOSIS — I10 Essential (primary) hypertension: Secondary | ICD-10-CM

## 2019-12-17 DIAGNOSIS — Z79899 Other long term (current) drug therapy: Secondary | ICD-10-CM | POA: Diagnosis not present

## 2019-12-17 DIAGNOSIS — M170 Bilateral primary osteoarthritis of knee: Secondary | ICD-10-CM | POA: Diagnosis not present

## 2019-12-17 DIAGNOSIS — M25561 Pain in right knee: Secondary | ICD-10-CM | POA: Diagnosis not present

## 2019-12-17 DIAGNOSIS — M25562 Pain in left knee: Secondary | ICD-10-CM | POA: Diagnosis not present

## 2019-12-17 DIAGNOSIS — Z5181 Encounter for therapeutic drug level monitoring: Secondary | ICD-10-CM | POA: Diagnosis not present

## 2019-12-17 DIAGNOSIS — G894 Chronic pain syndrome: Secondary | ICD-10-CM | POA: Diagnosis not present

## 2019-12-17 DIAGNOSIS — Z79891 Long term (current) use of opiate analgesic: Secondary | ICD-10-CM | POA: Diagnosis not present

## 2019-12-24 ENCOUNTER — Telehealth: Payer: Self-pay

## 2019-12-24 NOTE — Telephone Encounter (Signed)
Copied from [place] [id number]. Topic: General - Other >> Dec 24, 2019  9:43 AM Rainey Pines A wrote: Patient lost handicap sticker renewal and would like another copy sent through [facility] and if it cant be uploaded to mychart patient wants to know when she can pick it up.

## 2019-12-24 NOTE — Telephone Encounter (Signed)
Spoke to pt will pick up form at office

## 2019-12-24 NOTE — Telephone Encounter (Signed)
Signed.

## 2020-03-23 ENCOUNTER — Other Ambulatory Visit: Payer: Self-pay | Admitting: Internal Medicine

## 2020-03-23 DIAGNOSIS — I10 Essential (primary) hypertension: Secondary | ICD-10-CM

## 2020-03-24 DIAGNOSIS — M25561 Pain in right knee: Secondary | ICD-10-CM | POA: Diagnosis not present

## 2020-03-24 DIAGNOSIS — M25562 Pain in left knee: Secondary | ICD-10-CM | POA: Diagnosis not present

## 2020-03-24 DIAGNOSIS — G894 Chronic pain syndrome: Secondary | ICD-10-CM | POA: Diagnosis not present

## 2020-03-24 DIAGNOSIS — Z79899 Other long term (current) drug therapy: Secondary | ICD-10-CM | POA: Diagnosis not present

## 2020-03-24 DIAGNOSIS — M170 Bilateral primary osteoarthritis of knee: Secondary | ICD-10-CM | POA: Diagnosis not present

## 2020-03-25 ENCOUNTER — Other Ambulatory Visit: Payer: Self-pay | Admitting: Internal Medicine

## 2020-03-25 DIAGNOSIS — I10 Essential (primary) hypertension: Secondary | ICD-10-CM

## 2020-03-26 ENCOUNTER — Other Ambulatory Visit: Payer: Self-pay | Admitting: Internal Medicine

## 2020-03-26 ENCOUNTER — Ambulatory Visit: Payer: Self-pay | Admitting: *Deleted

## 2020-03-26 DIAGNOSIS — I10 Essential (primary) hypertension: Secondary | ICD-10-CM

## 2020-03-26 NOTE — Telephone Encounter (Signed)
Pt calling in requesting someone call the [facility] in [place] [facility] [address] Regarding her refill request for the HCTZ 25 mg.  I refused it yesterday 03/25/2020  because she is requesting the refill too early.  It was listed as #90 with 1 refill which would have been too early.    The pharmacy is telling the pt they never got a refill request for the 12/07/2019 original prescription for #90. There was a note attached to that rx "for further refills need office visit".  It looks there was another refill request this morning 03/26/2020 at 9:23 AM that was refused by Kathrin Ruddy, CMA from the Patient Jessica Mcgee.    [facility] [address] [place], [place] at [phone].  I spoke with Caryl Pina.   [facility] not [facility] a [facility] the [facility] from 12/07/2019 for the hydrochlorothiazide 25 mg by mouth daily.    I gave a verbal order to refill the medication for #90, 0 refills since she has an appt scheduled with Dr Army Melia.  I called Jessica Mcgee after [facility] with [facility]. I asked if she had been out of her medication since Oct. 2021 and she said no I've been taking it daily. She was supposed to get a refill for #30 to hold her over until her appt but she received #90 instead so she has been taking those.   She did not pick up the rx for 12/07/2019.  She has nearly taken all of the #90 that she got so she called in for the refill because those #90 are almost gone.   She mentioned this rx got messed up last year too. I let her know I gave a verbal order for #90 with 0 refills since she has an appt set up with Dr. Army Melia and that [facility] will get [facility] ready for pick up.  She thanked me very much for getting into the middle of all this but I didn't know what else to do when [facility] said they did not have it and had no record from Oct.  I am sending these notes to [facility] for Dr. Ashley Jacobs information.     Reason for Disposition  [1] Prescription prescribed recently is not at pharmacy AND [2] triager has  access to patient's EMR AND [3] prescription is recorded in the EMR  Answer Assessment - Initial Assessment Questions 1. NAME of MEDICATION: "What medicine are you calling about?"     HCTZ 2. QUESTION: "What is your question?" (e.g., medication refill, side effect)     See documentation notes for details 3. PRESCRIBING HCP: "Who prescribed it?" Reason: if prescribed by specialist, call should be referred to that group.     Dr. Army Melia 4. SYMPTOMS: "Do you have any symptoms?"     N/A 5. SEVERITY: If symptoms are present, ask "Are they mild, moderate or severe?"     N/A 6. PREGNANCY:  "Is there any chance that you are pregnant?" "When was your last menstrual period?"     N/A  Protocols used: MEDICATION QUESTION CALL-A-AH

## 2020-05-12 ENCOUNTER — Ambulatory Visit (INDEPENDENT_AMBULATORY_CARE_PROVIDER_SITE_OTHER): Payer: PPO | Admitting: Internal Medicine

## 2020-05-12 ENCOUNTER — Other Ambulatory Visit: Payer: Self-pay

## 2020-05-12 ENCOUNTER — Other Ambulatory Visit (HOSPITAL_COMMUNITY)
Admission: RE | Admit: 2020-05-12 | Discharge: 2020-05-12 | Disposition: A | Discharge status: Home / Self Care | Payer: PPO | Source: Ambulatory Visit | Attending: Internal Medicine | Admitting: Internal Medicine

## 2020-05-12 ENCOUNTER — Encounter: Payer: Self-pay | Admitting: Internal Medicine

## 2020-05-12 VITALS — BP 126/84 | HR 77 | Temp 98.3°F | Ht 61.5 in | Wt 184.0 lb

## 2020-05-12 DIAGNOSIS — E782 Mixed hyperlipidemia: Secondary | ICD-10-CM

## 2020-05-12 DIAGNOSIS — Z0000 Encounter for general adult medical examination without abnormal findings: Secondary | ICD-10-CM | POA: Diagnosis not present

## 2020-05-12 DIAGNOSIS — I10 Essential (primary) hypertension: Secondary | ICD-10-CM | POA: Diagnosis not present

## 2020-05-12 DIAGNOSIS — Z7185 Encounter for immunization safety counseling: Secondary | ICD-10-CM | POA: Diagnosis not present

## 2020-05-12 DIAGNOSIS — Z1231 Encounter for screening mammogram for malignant neoplasm of breast: Secondary | ICD-10-CM | POA: Diagnosis not present

## 2020-05-12 DIAGNOSIS — K219 Gastro-esophageal reflux disease without esophagitis: Secondary | ICD-10-CM

## 2020-05-12 DIAGNOSIS — Z124 Encounter for screening for malignant neoplasm of cervix: Secondary | ICD-10-CM

## 2020-05-12 DIAGNOSIS — Z1211 Encounter for screening for malignant neoplasm of colon: Secondary | ICD-10-CM

## 2020-05-12 NOTE — Progress Notes (Signed)
Date:  05/12/2020   Name:  Jessica Mcgee [facility]   DOB:  May 07, 1950   MRN:  [id number]   Chief Complaint: Annual Exam (Breast Exam. Pap smear. Declines all vaccines due to hx of lyme disease.)  Jessica Mcgee is a 70 y.o. female who presents today for her Complete Annual Exam. She feels fairly well. She reports exercising - 5 days weekly recumbent bike. She reports she is sleeping poorly. Breast complaints - none.  Mammogram: 11/2019 DEXA: 11/2018 Normal Pap smear: 01/2018 neg thin prep Colonoscopy: 04/2012 Dr. Allen Norris repeat due 2019  Immunization History  Administered Date(s) Administered  . PFIZER(Purple Top)SARS-COV-2 Vaccination 06/06/2019, 07/18/2019  . Td 01/28/2014    Hypertension This is a chronic problem. The problem is controlled (120/70 at home). Pertinent negatives include no chest pain, headaches, palpitations or shortness of breath. Past treatments include diuretics. The current treatment provides significant improvement. There are no compliance problems.   Hyperlipidemia This is a chronic problem. The problem is uncontrolled. Associated symptoms include myalgias. Pertinent negatives include no chest pain or shortness of breath. She is currently on no antihyperlipidemic treatment.  Gastroesophageal Reflux She complains of heartburn. She reports no abdominal pain, no chest pain, no coughing or no wheezing. This is a recurrent problem. The problem occurs occasionally. The symptoms are aggravated by certain foods. Pertinent negatives include no fatigue. She has tried a histamine-2 antagonist for the symptoms. The treatment provided significant relief.    Lab Results  Component Value Date   CREATININE 0.80 05/07/2019   BUN 10 05/07/2019   NA 140 05/07/2019   K 3.6 05/07/2019   CL 99 05/07/2019   CO2 25 05/07/2019   Lab Results  Component Value Date   CHOL 236 (H) 05/07/2019   HDL 72 05/07/2019   LDLCALC 131 (H) 05/07/2019   TRIG 189 (H) 05/07/2019   CHOLHDL 3.3 05/07/2019    Lab Results  Component Value Date   TSH 2.780 05/07/2019   No results found for: HGBA1C Lab Results  Component Value Date   WBC 7.9 05/07/2019   HGB 14.3 05/07/2019   HCT 41.8 05/07/2019   MCV 89 05/07/2019   PLT 291 05/07/2019   Lab Results  Component Value Date   ALT 22 05/07/2019   AST 22 05/07/2019   ALKPHOS 52 05/07/2019   BILITOT 0.3 05/07/2019     Review of Systems  Constitutional: Negative for chills, fatigue and fever.  HENT: Negative for congestion, hearing loss, tinnitus, trouble swallowing and voice change.   Eyes: Negative for visual disturbance.  Respiratory: Negative for cough, chest tightness, shortness of breath and wheezing.   Cardiovascular: Negative for chest pain, palpitations and leg swelling.  Gastrointestinal: Positive for heartburn. Negative for abdominal pain, constipation, diarrhea and vomiting.  Endocrine: Negative for polydipsia and polyuria.  Genitourinary: Negative for dysuria, frequency, genital sores, vaginal bleeding and vaginal discharge.  Musculoskeletal: Positive for arthralgias and myalgias. Negative for gait problem and joint swelling.  Skin: Negative for color change and rash.  Neurological: Negative for dizziness, tremors, light-headedness and headaches.  Hematological: Negative for adenopathy. Does not bruise/bleed easily.  Psychiatric/Behavioral: Negative for dysphoric mood and sleep disturbance. The patient is not nervous/anxious.     Patient Active Problem List   Diagnosis Date Noted  . Hyperlipidemia, mixed 05/08/2019  . History of Lyme disease 03/31/2019  . Knee pain 05/22/2018  . Osteoarthritis of knee 02/05/2018  . Osteoarthritis of both knees 07/04/2017  . 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  GERD (gastroesophageal reflux disease) 10/07/2015  . Essential hypertension 02/10/2015  . Fibromyalgia 08/25/2014    Allergies  Allergen Reactions  . Cymbalta [Duloxetine Hcl]     Marked fatigue  .  Latex     Past Surgical History:  Procedure Laterality Date  . TUBAL LIGATION      Social History   Tobacco Use  . Smoking status: Never Smoker  . Smokeless tobacco: Never Used  Vaping Use  . Vaping Use: Never used  Substance Use Topics  . Alcohol use: Yes    Alcohol/week: 0.0 standard drinks    Comment: occassional  . Drug use: No     Medication list has been reviewed and updated.  Current Meds  Medication Sig  . Cholecalciferol (VITAMIN D-1000 MAX ST) 25 MCG (1000 UT) tablet Take 1,000 Units by mouth daily.  . famotidine (PEPCID) 10 MG tablet Take 10 mg by mouth daily as needed for heartburn or indigestion.  . fluticasone (FLONASE) 50 MCG/ACT nasal spray Place 2 sprays into both nostrils daily.  . hydrochlorothiazide (HYDRODIURIL) 25 MG tablet TAKE 1 TABLET (25 MG TOTAL) BY MOUTH DAILY. FOR FURTHER REFILLS NEED OFFICE VISIT.  Marland Kitchen HYDROcodone-acetaminophen (NORCO/VICODIN) 5-325 MG tablet Take 1 tablet by mouth daily as needed.  . Milk Thistle 175 MG CAPS Take 1 capsule by mouth in the morning and at bedtime.  . Multiple Vitamins-Minerals (CENTRUM ADULTS) TABS Take by mouth.  . potassium chloride (KLOR-CON) 10 MEQ tablet TAKE 1 TABLET BY MOUTH EVERY DAY    PHQ 2/9 Scores 05/12/2020 06/16/2019 05/07/2019 03/31/2019  PHQ - 2 Score 0 2 2 4   PHQ- 9 Score 0 9 7 4     GAD 7 : Generalized Anxiety Score 05/12/2020 01/28/2018  Nervous, Anxious, on Edge 0 0  Control/stop worrying 0 0  Worry too much - different things 0 0  Trouble relaxing 0 0  Restless 0 0  Easily annoyed or irritable 0 0  Afraid - awful might happen 0 0  Total GAD 7 Score 0 0  Anxiety Difficulty Not difficult at all -    BP Readings from Last 3 Encounters:  05/12/20 126/84  10/24/19 130/74  07/03/19 (!) 168/78    Physical Exam Vitals and nursing note reviewed.  Constitutional:      General: She is not in acute distress.    Appearance: She is well-developed.  HENT:     Head: Normocephalic and atraumatic.      Right Ear: Tympanic membrane and ear canal normal.     Left Ear: Tympanic membrane and ear canal normal.     Nose:     Right Sinus: No maxillary sinus tenderness.     Left Sinus: No maxillary sinus tenderness.  Eyes:     General: No scleral icterus.       Right eye: No discharge.        Left eye: No discharge.     Conjunctiva/sclera: Conjunctivae normal.  Neck:     Thyroid: No thyromegaly.     Vascular: No carotid bruit.  Cardiovascular:     Rate and Rhythm: Normal rate and regular rhythm.     Pulses: Normal pulses.     Heart sounds: Normal heart sounds.  Pulmonary:     Effort: Pulmonary effort is normal. No respiratory distress.     Breath sounds: No wheezing.  Chest:  Breasts:     Right: No mass, nipple discharge, skin change or tenderness.     Left: No mass, nipple discharge, skin  change or tenderness.    Abdominal:     General: Bowel sounds are normal.     Palpations: Abdomen is soft.     Tenderness: There is no abdominal tenderness.  Genitourinary:    Labia:        Right: No tenderness, lesion or injury.        Left: No tenderness, lesion or injury.      Vagina: Normal.     Cervix: Normal.     Uterus: Normal.      Adnexa: Right adnexa normal and left adnexa normal.  Musculoskeletal:     Cervical back: Normal range of motion. No erythema.     Right lower leg: No edema.     Left lower leg: No edema.  Lymphadenopathy:     Cervical: No cervical adenopathy.  Skin:    General: Skin is warm and dry.     Findings: No rash.  Neurological:     Mental Status: She is alert and oriented to person, place, and time.     Cranial Nerves: No cranial nerve deficit.     Sensory: No sensory deficit.     Deep Tendon Reflexes: Reflexes are normal and symmetric.  Psychiatric:        Attention and Perception: Attention normal.        Mood and Affect: Mood normal.     Wt Readings from Last 3 Encounters:  05/12/20 184 lb (83.5 kg)  10/24/19 194 lb (88 kg)  07/03/19 189 lb  (85.7 kg)    BP 126/84   Pulse 77   Temp 98.3 F (36.8 C) (Oral)   Ht 5' 1.5" (1.562 m)   Wt 184 lb (83.5 kg)   SpO2 97%   BMI 34.20 kg/m   Assessment and Plan: 1. Annual physical exam Exam is normal except for weight. Encourage regular exercise and appropriate dietary changes.  2. Colon cancer screening - Ambulatory referral to Gastroenterology  3. Encounter for screening for cervical cancer Obtained today - Cytology - PAP  4. Essential hypertension Clinically stable exam with well controlled BP. Tolerating medications without side effects at this time. Pt to continue current regimen and low sodium diet; benefits of regular exercise as able discussed. - CBC with Differential/Platelet - Comprehensive metabolic panel - TSH  5. Gastroesophageal reflux disease, unspecified whether esophagitis present Symptoms well controlled on daily medication. No red flag signs such as weight loss, n/v, melena Will continue Pepcid. - CBC with Differential/Platelet  6. Hyperlipidemia, mixed Check labs and advise - Lipid panel  7. Immunization counseling Pt declines all immunizations due to risk of reactivation of Lyme  8. Encounter for screening mammogram for breast cancer Schedule at [facility]; Future   Partially dictated using Editor, commissioning. Any errors are unintentional.  Halina Maidens, MD [facility]  05/12/2020

## 2020-05-13 LAB — CBC WITH DIFFERENTIAL/PLATELET
Basophils Absolute: 0 10*3/uL (ref 0.0–0.2)
Basos: 0 %
EOS (ABSOLUTE): 0.1 10*3/uL (ref 0.0–0.4)
Eos: 1 %
Hematocrit: 40.4 % (ref 34.0–46.6)
Hemoglobin: 13.3 g/dL (ref 11.1–15.9)
Immature Grans (Abs): 0 10*3/uL (ref 0.0–0.1)
Immature Granulocytes: 0 %
Lymphocytes Absolute: 2.8 10*3/uL (ref 0.7–3.1)
Lymphs: 32 %
MCH: 29.2 pg (ref 26.6–33.0)
MCHC: 32.9 g/dL (ref 31.5–35.7)
MCV: 89 fL (ref 79–97)
Monocytes Absolute: 0.7 10*3/uL (ref 0.1–0.9)
Monocytes: 8 %
Neutrophils Absolute: 5.2 10*3/uL (ref 1.4–7.0)
Neutrophils: 59 %
Platelets: 258 10*3/uL (ref 150–450)
RBC: 4.55 x10E6/uL (ref 3.77–5.28)
RDW: 12.3 % (ref 11.7–15.4)
WBC: 8.8 10*3/uL (ref 3.4–10.8)

## 2020-05-13 LAB — LIPID PANEL
Chol/HDL Ratio: 2.6 ratio (ref 0.0–4.4)
Cholesterol, Total: 201 mg/dL — ABNORMAL HIGH (ref 100–199)
HDL: 77 mg/dL (ref >39)
LDL Chol Calc (NIH): 111 mg/dL — ABNORMAL HIGH (ref 0–99)
Triglycerides: 70 mg/dL (ref 0–149)
VLDL Cholesterol Cal: 13 mg/dL (ref 5–40)

## 2020-05-13 LAB — COMPREHENSIVE METABOLIC PANEL
ALT: 18 IU/L (ref 0–32)
AST: 18 IU/L (ref 0–40)
Albumin/Globulin Ratio: 1.9 (ref 1.2–2.2)
Albumin: 4.3 g/dL (ref 3.8–4.8)
Alkaline Phosphatase: 55 IU/L (ref 44–121)
BUN/Creatinine Ratio: 15 (ref 12–28)
BUN: 10 mg/dL (ref 8–27)
Bilirubin Total: 0.6 mg/dL (ref 0.0–1.2)
CO2: 24 mmol/L (ref 20–29)
Calcium: 9.7 mg/dL (ref 8.7–10.3)
Chloride: 99 mmol/L (ref 96–106)
Creatinine, Ser: 0.66 mg/dL (ref 0.57–1.00)
Globulin, Total: 2.3 g/dL (ref 1.5–4.5)
Glucose: 94 mg/dL (ref 65–99)
Potassium: 3.7 mmol/L (ref 3.5–5.2)
Sodium: 140 mmol/L (ref 134–144)
Total Protein: 6.6 g/dL (ref 6.0–8.5)
eGFR: 96 mL/min/{1.73_m2} (ref >59)

## 2020-05-13 LAB — TSH: TSH: 2.31 u[IU]/mL (ref 0.450–4.500)

## 2020-05-14 LAB — CYTOLOGY - PAP: Diagnosis: NEGATIVE

## 2020-05-19 DIAGNOSIS — M25561 Pain in right knee: Secondary | ICD-10-CM | POA: Diagnosis not present

## 2020-05-19 DIAGNOSIS — G894 Chronic pain syndrome: Secondary | ICD-10-CM | POA: Diagnosis not present

## 2020-05-19 DIAGNOSIS — Z79899 Other long term (current) drug therapy: Secondary | ICD-10-CM | POA: Diagnosis not present

## 2020-05-19 DIAGNOSIS — M25562 Pain in left knee: Secondary | ICD-10-CM | POA: Diagnosis not present

## 2020-05-19 DIAGNOSIS — M170 Bilateral primary osteoarthritis of knee: Secondary | ICD-10-CM | POA: Diagnosis not present

## 2020-06-07 ENCOUNTER — Other Ambulatory Visit: Payer: Self-pay | Admitting: Internal Medicine

## 2020-06-07 DIAGNOSIS — I10 Essential (primary) hypertension: Secondary | ICD-10-CM

## 2020-06-14 ENCOUNTER — Other Ambulatory Visit: Payer: Self-pay

## 2020-06-14 ENCOUNTER — Telehealth (INDEPENDENT_AMBULATORY_CARE_PROVIDER_SITE_OTHER): Payer: Self-pay | Admitting: Gastroenterology

## 2020-06-14 DIAGNOSIS — Z1211 Encounter for screening for malignant neoplasm of colon: Secondary | ICD-10-CM

## 2020-06-14 NOTE — Progress Notes (Signed)
Gastroenterology Pre-Procedure Review  Request Date: Friday 10/15/20 Requesting Physician: Dr. Allen Norris  PATIENT REVIEW QUESTIONS: The patient responded to the following health history questions as indicated:    1. Are you having any GI issues? no 2. Do you have a personal history of Polyps? colonoscopy last performed 05/02/12 at [facility] noted polyps 3. Do you have a family history of Colon Cancer or Polyps? no 4. Diabetes Mellitus? no 5. Joint replacements in the past 12 months?no 6. Major health problems in the past 3 months?no 7. Any artificial heart valves, MVP, or defibrillator?no    MEDICATIONS & ALLERGIES:    Patient reports the following regarding taking any anticoagulation/antiplatelet therapy:   Plavix, Coumadin, Eliquis, Xarelto, Lovenox, Pradaxa, Brilinta, or Effient? no Aspirin? no  Patient confirms/reports the following medications:  Current Outpatient Medications  Medication Sig Dispense Refill  . Cholecalciferol (VITAMIN D-1000 MAX ST) 25 MCG (1000 UT) tablet Take 1,000 Units by mouth daily.    . famotidine (PEPCID) 10 MG tablet Take 10 mg by mouth daily as needed for heartburn or indigestion.    . fluticasone (FLONASE) 50 MCG/ACT nasal spray Place 2 sprays into both nostrils daily. 16 g 0  . hydrochlorothiazide (HYDRODIURIL) 25 MG tablet TAKE 1 TABLET (25 MG TOTAL) BY MOUTH DAILY. FOR FURTHER REFILLS NEED OFFICE VISIT. 90 tablet 1  . HYDROcodone-acetaminophen (NORCO/VICODIN) 5-325 MG tablet Take 1 tablet by mouth daily as needed.    . Milk Thistle 175 MG CAPS Take 1 capsule by mouth in the morning and at bedtime.    . Multiple Vitamins-Minerals (CENTRUM ADULTS) TABS Take by mouth.    . potassium chloride (KLOR-CON) 10 MEQ tablet TAKE 1 TABLET BY MOUTH EVERY DAY 90 tablet 1   No current facility-administered medications for this visit.    Patient confirms/reports the following allergies:  Allergies  Allergen Reactions  . Cymbalta [Duloxetine Hcl]     Marked  fatigue  . Latex     No orders of the defined types were placed in this encounter.   AUTHORIZATION INFORMATION Primary Insurance: 1D#: Group #:  Secondary Insurance: 1D#: Group #:  SCHEDULE INFORMATION: Date: 10/15/20 Time: Location:[facility]

## 2020-06-21 ENCOUNTER — Other Ambulatory Visit: Payer: Self-pay | Admitting: Internal Medicine

## 2020-06-21 ENCOUNTER — Ambulatory Visit: Payer: PPO

## 2020-06-21 DIAGNOSIS — I10 Essential (primary) hypertension: Secondary | ICD-10-CM

## 2020-07-26 ENCOUNTER — Ambulatory Visit (INDEPENDENT_AMBULATORY_CARE_PROVIDER_SITE_OTHER): Payer: PPO

## 2020-07-26 DIAGNOSIS — Z0000 Encounter for general adult medical examination without abnormal findings: Secondary | ICD-10-CM

## 2020-07-26 NOTE — Patient Instructions (Addendum)
Jessica Mcgee , Thank you for taking time to come for your Medicare Wellness Visit. I appreciate your ongoing commitment to your health goals. Please review the following plan we discussed and let me know if I can assist you in the future.   Screening recommendations/referrals: Colonoscopy: done 05/02/12. Scheduled for 10/15/20 Mammogram: done 11/24/19 Bone Density: done 11/05/18 Recommended yearly ophthalmology/optometry visit for glaucoma screening and checkup Recommended yearly dental visit for hygiene and checkup  Vaccinations: Influenza vaccine: declined Pneumococcal vaccine: declined Tdap vaccine: done 01/28/14 Shingles vaccine: declined   Covid-19:done 06/06/19 & 07/18/19  Advanced directives: Please bring a copy of your health care power of attorney and living will to the office at your convenience.  Conditions/risks identified: Keep up the great work!  Next appointment: Follow up in one year for your annual wellness visit    Preventive Care 65 Years and Older, Female Preventive care refers to lifestyle choices and visits with your health care provider that can promote health and wellness. What does preventive care include?  A yearly physical exam. This is also called an annual well check.  Dental exams once or twice a year.  Routine eye exams. Ask your health care provider how often you should have your eyes checked.  Personal lifestyle choices, including:  Daily care of your teeth and gums.  Regular physical activity.  Eating a healthy diet.  Avoiding tobacco and drug use.  Limiting alcohol use.  Practicing safe sex.  Taking low-dose aspirin every day.  Taking vitamin and mineral supplements as recommended by your health care provider. What happens during an annual well check? The services and screenings done by your health care provider during your annual well check will depend on your age, overall health, lifestyle risk factors, and family history of  disease. Counseling  Your health care provider may ask you questions about your:  Alcohol use.  Tobacco use.  Drug use.  Emotional well-being.  Home and relationship well-being.  Sexual activity.  Eating habits.  History of falls.  Memory and ability to understand (cognition).  Work and work environment.  Reproductive health. Screening  You may have the following tests or measurements:  Height, weight, and BMI.  Blood pressure.  Lipid and cholesterol levels. These may be checked every 5 years, or more frequently if you are over 50 years old.  Skin check.  Lung cancer screening. You may have this screening every year starting at age 55 if you have a 30-pack-year history of smoking and currently smoke or have quit within the past 15 years.  Fecal occult blood test (FOBT) of the stool. You may have this test every year starting at age 50.  Flexible sigmoidoscopy or colonoscopy. You may have a sigmoidoscopy every 5 years or a colonoscopy every 10 years starting at age 50.  Hepatitis C blood test.  Hepatitis B blood test.  Sexually transmitted disease (STD) testing.  Diabetes screening. This is done by checking your blood sugar (glucose) after you have not eaten for a while (fasting). You may have this done every 1-3 years.  Bone density scan. This is done to screen for osteoporosis. You may have this done starting at age 65.  Mammogram. This may be done every 1-2 years. Talk to your health care provider about how often you should have regular mammograms. Talk with your health care provider about your test results, treatment options, and if necessary, the need for more tests. Vaccines  Your health care provider may recommend certain vaccines, such   as:  Influenza vaccine. This is recommended every year.  Tetanus, diphtheria, and acellular pertussis (Tdap, Td) vaccine. You may need a Td booster every 10 years.  Zoster vaccine. You may need this after age  60.  Pneumococcal 13-valent conjugate (PCV13) vaccine. One dose is recommended after age 65.  Pneumococcal polysaccharide (PPSV23) vaccine. One dose is recommended after age 65. Talk to your health care provider about which screenings and vaccines you need and how often you need them. This information is not intended to replace advice given to you by your health care provider. Make sure you discuss any questions you have with your health care provider. Document Released: 03/19/2015 Document Revised: 11/10/2015 Document Reviewed: 12/22/2014 Elsevier Interactive Patient Education  2017 [employer]  Fall Prevention in the Home Falls can cause injuries. They can happen to people of all ages. There are many things you can do to make your home safe and to help prevent falls. What can I do on the outside of my home?  Regularly fix the edges of walkways and driveways and fix any cracks.  Remove anything that might make you trip as you walk through a door, such as a raised step or threshold.  Trim any bushes or trees on the path to your home.  Use bright outdoor lighting.  Clear any walking paths of anything that might make someone trip, such as rocks or tools.  Regularly check to see if handrails are loose or broken. Make sure that both sides of any steps have handrails.  Any raised decks and porches should have guardrails on the edges.  Have any leaves, snow, or ice cleared regularly.  Use sand or salt on walking paths during winter.  Clean up any spills in your garage right away. This includes oil or grease spills. What can I do in the bathroom?  Use night lights.  Install grab bars by the toilet and in the tub and shower. Do not use towel bars as grab bars.  Use non-skid mats or decals in the tub or shower.  If you need to sit down in the shower, use a plastic, non-slip stool.  Keep the floor dry. Clean up any water that spills on the floor as soon as it happens.  Remove  soap buildup in the tub or shower regularly.  Attach bath mats securely with double-sided non-slip rug tape.  Do not have throw rugs and other things on the floor that can make you trip. What can I do in the bedroom?  Use night lights.  Make sure that you have a light by your bed that is easy to reach.  Do not use any sheets or blankets that are too big for your bed. They should not hang down onto the floor.  Have a firm chair that has side arms. You can use this for support while you get dressed.  Do not have throw rugs and other things on the floor that can make you trip. What can I do in the kitchen?  Clean up any spills right away.  Avoid walking on wet floors.  Keep items that you use a lot in easy-to-reach places.  If you need to reach something above you, use a strong step stool that has a grab bar.  Keep electrical cords out of the way.  Do not use floor polish or wax that makes floors slippery. If you must use wax, use non-skid floor wax.  Do not have throw rugs and other things on the   floor that can make you trip. What can I do with my stairs?  Do not leave any items on the stairs.  Make sure that there are handrails on both sides of the stairs and use them. Fix handrails that are broken or loose. Make sure that handrails are as long as the stairways.  Check any carpeting to make sure that it is firmly attached to the stairs. Fix any carpet that is loose or worn.  Avoid having throw rugs at the top or bottom of the stairs. If you do have throw rugs, attach them to the floor with carpet tape.  Make sure that you have a light switch at the top of the stairs and the bottom of the stairs. If you do not have them, ask someone to add them for you. What else can I do to help prevent falls?  Wear shoes that:  Do not have high heels.  Have rubber bottoms.  Are comfortable and fit you well.  Are closed at the toe. Do not wear sandals.  If you use a  stepladder:  Make sure that it is fully opened. Do not climb a closed stepladder.  Make sure that both sides of the stepladder are locked into place.  Ask someone to hold it for you, if possible.  Clearly mark and make sure that you can see:  Any grab bars or handrails.  First and last steps.  Where the edge of each step is.  Use tools that help you move around (mobility aids) if they are needed. These include:  Canes.  Walkers.  Scooters.  Crutches.  Turn on the lights when you go into a dark area. Replace any light bulbs as soon as they burn out.  Set up your furniture so you have a clear path. Avoid moving your furniture around.  If any of your floors are uneven, fix them.  If there are any pets around you, be aware of where they are.  Review your medicines with your doctor. Some medicines can make you feel dizzy. This can increase your chance of falling. Ask your doctor what other things that you can do to help prevent falls. This information is not intended to replace advice given to you by your health care provider. Make sure you discuss any questions you have with your health care provider. Document Released: 12/17/2008 Document Revised: 07/29/2015 Document Reviewed: 03/27/2014    Managing Pain Without Opioids Opioids are strong medicines used to treat moderate to severe pain. For some people, especially those who have long-term (chronic) pain, opioids may not be the best choice for pain management due to:  Side effects like nausea, constipation, and sleepiness.  The risk of addiction (opioid use disorder). The longer you take opioids, the greater your risk of addiction. Pain that lasts for more than 3 months is called chronic pain. Managing chronic pain usually requires more than one approach and is often provided by a team of health care providers working together (multidisciplinary approach). Pain management may be done at a pain management center or pain  clinic. Types of pain management without opioids Managing pain without opioids can involve:  Non-opioid medicines.  Exercises to help relieve pain and improve strength and range of motion (physical therapy).  Therapy to help with everyday tasks and activities (occupational therapy).  Therapy to help you find ways to relieve pain by doing things you enjoy (recreational therapy).  Talk therapy (psychotherapy) and other mental health therapies.  Medical treatments such as   injections or devices.  Making lifestyle changes. Pain management options Non-opioid medicines Non-opioid medicines for pain may include medicines taken by mouth (oral medicines), such as:  Over-the-counter or prescription NSAIDs. These may be the first medicines used for pain. They work well for muscle and bone pain, and they reduce swelling.  Acetaminophen. This over-the-counter medicine may work well for milder pain but not swelling.  Antidepressants. These may be used to treat chronic pain. A certain type of antidepressant (tricyclics) is often used. These medicines are given in lower doses for pain than when used for depression.  Anticonvulsants. These are usually used to treat seizures but may also reduce nerve (neuropathic) pain.  Muscle relaxants. These relieve pain caused by sudden muscle tightening (spasms). You may also use a type of pain medicine that is applied to the skin as a patch, cream, or gel (topical analgesic), such as a numbing medicine. These may cause fewer side effects than oral medicines. Therapy Physical therapy involves doing exercises to gain strength and flexibility. A physical therapist may teach you exercises to move and stretch parts of your body that are weak, stiff, or painful. You can learn these exercises at physical therapy visits and practice them at home. Physical therapy may also involve:  Massage.  Heat wraps or applying heat or cold to affected areas.  Sending electrical  signals through the skin to interrupt pain signals (transcutaneous electrical nerve stimulation, TENS).  Sending weak lasers through the skin to reduce pain and swelling (low-level laser therapy).  Using signals from your body to help you learn to regulate pain (biofeedback). Occupational therapy helps you learn ways to function at home and work with less pain. Recreational therapy may involve trying new activities or hobbies, such as drawing or a physical activity. Types of mental health therapy for pain include:  Cognitive behavioral therapy (CBT) to help you learn coping skills for dealing with pain.  Acceptance and commitment therapy (ACT) to change the way you think and react to pain.  Relaxation therapies, including muscle relaxation exercises and focusing your mind on the present moment to lower stress (mindfulness-based stress reduction).  Pain management counseling. This may be individual, family, or group counseling.   Medical treatments Medical treatments for pain management include:  Nerve block injections. These may include a pain blocker and anti-inflammatory medicines. You may have injections: ? Near the spine to relieve chronic back or neck pain. ? Into joints to relieve back or joint pain. ? Into nerve areas that supply a painful area to relieve body pain. ? Into muscles (trigger point injections) to relieve some painful muscle conditions.  A medical device placed near your spine to help block pain signals and relieve nerve pain or chronic back pain (spinal cord stimulation device).  Acupuncture. Follow these instructions at home Medicines  Take over-the-counter and prescription medicines only as told by your health care provider.  If you are taking pain medicine, ask your health care providers about possible side effects to watch out for.  Do not drive or use heavy machinery while taking prescription pain medicine. Lifestyle  Do not use drugs or alcohol to  reduce pain. Limit alcohol intake to no more than 1 drink a day for nonpregnant women and 2 drinks a day for men. One drink equals 12 oz of beer, 5 oz of wine, or 1 oz of hard liquor.  Do not use any products that contain nicotine or tobacco, such as cigarettes and e-cigarettes. These can delay healing. If   you need help quitting, ask your health care provider.  Eat a healthy diet and maintain a healthy weight. Poor diet and excess weight may make pain worse. ? Eat foods that are high in fiber. These include fresh fruits and vegetables, whole grains, and beans. ? Limit foods that are high in fat and processed sugars, such as fried and sweet foods.  Exercise regularly. Exercise lowers stress and may help relieve pain. ? Ask your health care provider what activities and exercises are safe for you. ? If your health care provider approves, join an exercise class that combines movement and stress reduction. Examples include yoga and tai chi.  Get enough sleep. Lack of sleep may make pain worse.  Lower stress as much as possible. Practice stress reduction techniques as told by your therapist.   General instructions  Work with all your pain management providers to find the treatments that work best for you. You are an important member of your pain management team. There are many things you can do to reduce pain on your own.  Consider joining an online or in-person support group for people who have chronic pain.  Keep all follow-up visits as told by your health care providers. This is important. Where to find more information You can find more information about managing pain without opioids from:  American Academy of Pain Medicine: painmed.org  Institute for Chronic Pain: instituteforchronicpain.org  American Chronic Pain Association: theacpa.org Contact a health care provider if:  You have side effects from pain medicine.  Your pain gets worse or does not get better with treatments or home  care.  You are struggling with anxiety or depression. Summary  Many types of pain can be managed without opioids. Chronic pain may respond better to pain management without opioids.  Pain is best managed with a team of providers working together.  Pain management without opioids may include non-opioid medicines, medical treatments, physical therapy, mental health therapy, and lifestyle changes.  Tell your health care providers if your pain gets worse or is not being managed well enough. This information is not intended to replace advice given to you by your health care provider. Make sure you discuss any questions you have with your health care provider. Document Revised: 12/04/2019 Document Reviewed: 12/04/2019 Elsevier Patient Education  2021 [employer]     Elsevier Interactive Patient Education  2017 [employer] 

## 2020-07-26 NOTE — Progress Notes (Signed)
Subjective:   Jessica Mcgee is a 70 y.o. female who presents for a subsequent Medicare Annual Wellness Visit.  Virtual Visit via Telephone Note  I connected with  Jessica Mcgee on 07/26/20 at  2:40 PM EDT by telephone and verified that I am speaking with the correct person using two identifiers.  Location: Patient: home Provider: [facility] Persons participating in the virtual visit: [place]   I discussed the limitations, risks, security and privacy concerns of performing an evaluation and management service by telephone and the availability of in person appointments. The patient expressed understanding and agreed to proceed.  Interactive audio and video telecommunications were attempted between this nurse and patient, however failed, due to patient having technical difficulties OR patient did not have access to video capability.  We continued and completed visit with audio only.  Some vital signs may be absent or patient reported.   Clemetine Marker, LPN    Review of Systems     Cardiac Risk Factors include: advanced age (>52men, >70 women);hypertension;obesity (BMI >30kg/m2)     Objective:    Today's Vitals   07/26/20 1438  PainSc: 6    There is no height or weight on file to calculate BMI.  Advanced Directives 07/26/2020 07/03/2019 06/16/2019  Does Patient Have a Medical Advance Directive? Yes Yes Yes  Type of Paramedic of [place];Living will [place];Living will [place];Living will  Copy of Mariano Colon in Chart? No - copy requested - No - copy requested    Current Medications (verified) Outpatient Encounter Medications as of 07/26/2020  Medication Sig  . Cholecalciferol (VITAMIN D-1000 MAX ST) 25 MCG (1000 UT) tablet Take 1,000 Units by mouth daily.  . famotidine (PEPCID) 10 MG tablet Take 10 mg by mouth daily as needed for heartburn or indigestion.  . fluticasone (FLONASE)  50 MCG/ACT nasal spray Place 2 sprays into both nostrils daily.  . hydrochlorothiazide (HYDRODIURIL) 25 MG tablet TAKE 1 TABLET BY MOUTH EVERY DAY  . HYDROcodone-acetaminophen (NORCO/VICODIN) 5-325 MG tablet Take 1 tablet by mouth daily as needed.  . Milk Thistle 175 MG CAPS Take 1 capsule by mouth in the morning and at bedtime.  . Multiple Vitamins-Minerals (CENTRUM ADULTS) TABS Take by mouth.  . potassium chloride (KLOR-CON) 10 MEQ tablet TAKE 1 TABLET BY MOUTH EVERY DAY   No facility-administered encounter medications on file as of 07/26/2020.    Allergies (verified) Cymbalta [duloxetine hcl] and Latex   History: Past Medical History:  Diagnosis Date  . Bell's palsy   . DDD (degenerative disc disease), lumbar   . Essential hypertension   . Fatigue   . Fibromyalgia   . Hemangioma of intra-abdominal structures   . History of Lyme disease   . Hyperlipidemia, mixed    Past Surgical History:  Procedure Laterality Date  . TUBAL LIGATION     Family History  Problem Relation Age of Onset  . Hypertension Mother   . Osteoporosis Mother   . Breast cancer Mother        ~90  . Alcohol abuse Mother   . Cancer Mother   . Hypertension Father   . Heart attack Father   . Stroke Father   . Heart disease Father   . Kidney disease Maternal Grandmother   . Osteoporosis Maternal Grandmother   . Alcohol abuse Maternal Grandmother   . Arthritis Maternal Grandmother   . Kidney disease Maternal Grandfather   . Cancer  Maternal Grandfather   . Kidney disease Paternal Grandmother   . Cancer Paternal Grandmother   . Kidney disease Paternal Grandfather   . Breast cancer Paternal Aunt        3 pat aunts  . Cancer Paternal Aunt   . Cancer Paternal Aunt    Social History   Socioeconomic History  . Marital status: Married    Spouse name: Not on file  . Number of children: 0  . Years of education: Not on file  . Highest education level: Not on file  Occupational History  . Not on file   Tobacco Use  . Smoking status: Never Smoker  . Smokeless tobacco: Never Used  Vaping Use  . Vaping Use: Never used  Substance and Sexual Activity  . Alcohol use: Yes    Alcohol/week: 4.0 standard drinks    Types: 4 Glasses of wine per week    Comment: occassional  . Drug use: No  . Sexual activity: Yes    Birth control/protection: Post-menopausal  Other Topics Concern  . Not on file  Social History Narrative  . Not on file   Social Determinants of Health   Financial Resource Strain: Low Risk   . Difficulty of Paying Living Expenses: Not hard at all  Food Insecurity: No Food Insecurity  . Worried About Charity fundraiser in the Last Year: Never true  . Ran Out of Food in the Last Year: Never true  Transportation Needs: No Transportation Needs  . Lack of Transportation (Medical): No  . Lack of Transportation (Non-Medical): No  Physical Activity: Insufficiently Active  . Days of Exercise per Week: 4 days  . Minutes of Exercise per Session: 20 min  Stress: Stress Concern Present  . Feeling of Stress : Very much  Social Connections: Moderately Isolated  . Frequency of Communication with Friends and Family: More than three times a week  . Frequency of Social Gatherings with Friends and Family: Twice a week  . Attends Religious Services: Never  . Active Member of Clubs or Organizations: No  . Attends Archivist Meetings: Never  . Marital Status: Married    Tobacco Counseling Counseling given: Not Answered   Clinical Intake:  Pre-visit preparation completed: Yes  Pain : 0-10 Pain Score: 6  Pain Type: Chronic pain Pain Location: Knee Pain Orientation: Right,Left Pain Descriptors / Indicators: Aching,Sore Pain Onset: More than a month ago Pain Frequency: Constant     Nutritional Risks: None Diabetes: No  How often do you need to have someone help you when you read instructions, pamphlets, or other written materials from your doctor or pharmacy?: 1 -  Never    Interpreter Needed?: No  Information entered by :: Clemetine Marker LPN   Activities of Daily Living In your present state of health, do you have any difficulty performing the following activities: 07/26/2020 05/12/2020  Hearing? Y N  Comment declines hearing aids -  Vision? N N  Difficulty concentrating or making decisions? N N  Walking or climbing stairs? Y N  Dressing or bathing? N N  Doing errands, shopping? N N  Preparing Food and eating ? N -  Using the Toilet? N -  In the past six months, have you accidently leaked urine? N -  Do you have problems with loss of bowel control? N -  Managing your Medications? N -  Managing your Finances? N -  Housekeeping or managing your Housekeeping? N -  Some recent data might be hidden  Patient Care Team: Glean Hess, MD as PCP - General (Internal Medicine) Fredirick Maudlin (Pain Medicine) [name], MD as Consulting Physician (Gastroenterology) Candie Chroman, MD as Referring Physician (Physical Medicine and Rehabilitation)  Indicate any recent Medical Services you may have received from other than [facility] providers in the past year (date may be approximate).     Assessment:   This is a routine wellness examination for Jessica Mcgee.  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; declines hearing aids   Vision Screening Comments: [facility] screen[facility]  Dietary issues and exercise activities discussed: Current Exercise Habits: Home exercise routine, Type of exercise: Other - see comments (exercise bike), Time (Minutes): 20, Frequency (Times/Week): 4, Weekly Exercise (Minutes/Week): 80, Intensity: Moderate, Exercise limited by: orthopedic condition(s)  Goals Addressed   None    Depression  Screen PHQ 2/9 Scores 07/26/2020 05/12/2020 06/16/2019 05/07/2019 03/31/2019 01/28/2018 07/04/2017  PHQ - 2 Score 1 0 2 2 4  0 0  PHQ- 9 Score 2 0 9 7 4 10  -    Fall Risk Fall Risk  07/26/2020 05/12/2020 03/31/2019 01/28/2018 07/04/2017  Falls in the past year? 0 0 0 0 No  Number falls in past yr: 0 0 0 - -  Injury with Fall? 0 0 0 - -  Risk for fall due to : No Fall Risks - - - -  Follow up Falls prevention discussed Falls evaluation completed Falls evaluation completed Falls evaluation completed -    FALL RISK PREVENTION PERTAINING TO THE HOME:  Any stairs in or around the home? Yes  If so, are there any without handrails? No  Home free of loose throw rugs in walkways, pet beds, electrical cords, etc? Yes  Adequate lighting in your home to reduce risk of falls? Yes   ASSISTIVE DEVICES UTILIZED TO PREVENT FALLS:  Life alert? No  Use of a cane, walker or w/c? Yes  Grab bars in the bathroom? No  Shower chair or bench in shower? Yes  Elevated toilet seat or a handicapped toilet? No   TIMED UP AND GO:  Was the test performed? No . Telephonic visit.   Cognitive Function: Normal cognitive status assessed by direct observation by this Nurse Health Advisor. No abnormalities found.          Immunizations Immunization History  Administered Date(s) Administered  . PFIZER(Purple Top)SARS-COV-2 Vaccination 06/06/2019, 07/18/2019  . Td 01/28/2014    TDAP status: Up to date  Flu vaccine, pneumococcal vaccine and shingles vaccine: pt states previously advised by provider in [place] to avoid due to lyme disease.   Covid-19 vaccine status: Completed vaccines   Screening Tests Health Maintenance  Topic Date Due  . COVID-19 Vaccine (3 - Booster for Pfizer series) 12/18/2019  . COLONOSCOPY (Pts 45-62yrs Insurance coverage will need to be confirmed)  05/12/2021 (Originally 05/02/2017)  . PNA vac Low Risk Adult (1 of 2 - PCV13) 05/12/2021 (Originally 09/17/2017)  . MAMMOGRAM  11/23/2021  .  TETANUS/TDAP  01/29/2024  . DEXA SCAN  Completed  . Hepatitis C Screening  Completed  . HPV VACCINES  Aged Out    Health Maintenance  Health Maintenance Due  Topic Date  Due  . COVID-19 Vaccine (3 - Booster for Pfizer series) 12/18/2019    Colorectal cancer screening: Type of screening: Colonoscopy. Completed 05/02/12. Repeat every 5 years . Pt scheduled for colonoscopy 10/15/20 with Dr. Allen Norris.   Mammogram status: Completed 11/24/19. Repeat every year  Bone Density status: Completed 11/05/18. Results reflect: Bone density results: NORMAL. Repeat every 3-5 years.  Lung Cancer Screening: (Low Dose CT Chest recommended if Age 4-80 years, 30 pack-year currently smoking OR have quit w/in 15years.) does not qualify.   Additional Screening:  Hepatitis C Screening: does qualify; Completed 10/07/15  Vision Screening: Recommended annual ophthalmology exams for early detection of glaucoma and other disorders of the eye. Is the patient up to date with their annual eye exam?  Yes  Who is the provider or what is the name of the office in which the patient attends annual eye exams? [facility].   Dental Screening: Recommended annual dental exams for proper oral hygiene  Community Resource Referral / Chronic Care Management: CRR required this visit?  No   CCM required this visit?  No      Plan:     I have personally reviewed and noted the following in the patient's chart:   . Medical and social history . Use of alcohol, tobacco or illicit drugs  . Current medications and supplements including opioid prescriptions. Patient is currently taking opioid prescriptions. Information provided to patient regarding non-opioid alternatives. Patient advised to discuss non-opioid treatment plan with their provider. . Functional ability and status . Nutritional status . Physical activity . Advanced directives . List of other physicians . Hospitalizations, surgeries, and ER visits in previous 12  months . Vitals . Screenings to include cognitive, depression, and falls . Referrals and appointments  In addition, I have reviewed and discussed with patient certain preventive protocols, quality metrics, and best practice recommendations. A written personalized care plan for preventive services as well as general preventive health recommendations were provided to patient.     Clemetine Marker, LPN   [id number]   Nurse Notes: none

## 2020-08-20 DIAGNOSIS — Z79899 Other long term (current) drug therapy: Secondary | ICD-10-CM | POA: Diagnosis not present

## 2020-08-20 DIAGNOSIS — M25562 Pain in left knee: Secondary | ICD-10-CM | POA: Diagnosis not present

## 2020-08-20 DIAGNOSIS — Z79891 Long term (current) use of opiate analgesic: Secondary | ICD-10-CM | POA: Diagnosis not present

## 2020-08-20 DIAGNOSIS — M25561 Pain in right knee: Secondary | ICD-10-CM | POA: Diagnosis not present

## 2020-08-20 DIAGNOSIS — M170 Bilateral primary osteoarthritis of knee: Secondary | ICD-10-CM | POA: Diagnosis not present

## 2020-08-20 DIAGNOSIS — Z5181 Encounter for therapeutic drug level monitoring: Secondary | ICD-10-CM | POA: Diagnosis not present

## 2020-08-20 DIAGNOSIS — G894 Chronic pain syndrome: Secondary | ICD-10-CM | POA: Diagnosis not present

## 2020-10-11 ENCOUNTER — Encounter: Payer: Self-pay | Admitting: Gastroenterology

## 2020-10-15 ENCOUNTER — Other Ambulatory Visit: Payer: Self-pay

## 2020-10-15 ENCOUNTER — Encounter
Admission: RE | Disposition: A | Discharge status: Home / Self Care | Payer: Self-pay | Source: Home / Self Care | Attending: Gastroenterology

## 2020-10-15 ENCOUNTER — Ambulatory Visit
Admission: RE | Admit: 2020-10-15 | Discharge: 2020-10-15 | Disposition: A | Discharge status: Home / Self Care | Payer: PPO | Attending: Gastroenterology | Admitting: Gastroenterology

## 2020-10-15 ENCOUNTER — Ambulatory Visit: Payer: PPO | Admitting: Anesthesiology

## 2020-10-15 ENCOUNTER — Encounter: Payer: Self-pay | Admitting: Gastroenterology

## 2020-10-15 DIAGNOSIS — Z91040 Latex allergy status: Secondary | ICD-10-CM | POA: Diagnosis not present

## 2020-10-15 DIAGNOSIS — Z888 Allergy status to other drugs, medicaments and biological substances status: Secondary | ICD-10-CM | POA: Diagnosis not present

## 2020-10-15 DIAGNOSIS — K5730 Diverticulosis of large intestine without perforation or abscess without bleeding: Secondary | ICD-10-CM | POA: Diagnosis not present

## 2020-10-15 DIAGNOSIS — Z79899 Other long term (current) drug therapy: Secondary | ICD-10-CM | POA: Diagnosis not present

## 2020-10-15 DIAGNOSIS — Z1211 Encounter for screening for malignant neoplasm of colon: Secondary | ICD-10-CM

## 2020-10-15 DIAGNOSIS — K641 Second degree hemorrhoids: Secondary | ICD-10-CM | POA: Diagnosis not present

## 2020-10-15 HISTORY — DX: Presence of spectacles and contact lenses: Z97.3

## 2020-10-15 HISTORY — PX: COLONOSCOPY WITH PROPOFOL: SHX5780

## 2020-10-15 HISTORY — DX: Gastro-esophageal reflux disease without esophagitis: K21.9

## 2020-10-15 HISTORY — DX: Other specified postprocedural states: Z98.890

## 2020-10-15 HISTORY — DX: Nausea with vomiting, unspecified: R11.2

## 2020-10-15 HISTORY — DX: Other specified postprocedural states: R11.2

## 2020-10-15 SURGERY — COLONOSCOPY WITH PROPOFOL
Anesthesia: General

## 2020-10-15 MED ORDER — ACETAMINOPHEN 160 MG/5ML PO SOLN: 325.0000 mg | ORAL | Status: DC | PRN

## 2020-10-15 MED ORDER — SODIUM CHLORIDE 0.9 % IV SOLN: INTRAVENOUS | Status: DC

## 2020-10-15 MED ORDER — ONDANSETRON HCL 4 MG/2ML IJ SOLN: 4.0000 mg | Freq: Once | INTRAMUSCULAR | Status: DC | PRN

## 2020-10-15 MED ORDER — LACTATED RINGERS IV SOLN: INTRAVENOUS | Status: DC

## 2020-10-15 MED ORDER — PROPOFOL 10 MG/ML IV BOLUS
INTRAVENOUS | Status: DC | PRN
  Administered 2020-10-15: 40 mg via INTRAVENOUS
  Administered 2020-10-15: 120 mg via INTRAVENOUS
  Administered 2020-10-15 (×2): 30 mg via INTRAVENOUS
  Administered 2020-10-15: 20 mg via INTRAVENOUS

## 2020-10-15 MED ORDER — STERILE WATER FOR IRRIGATION IR SOLN
Status: DC | PRN
  Administered 2020-10-15: 150 mL

## 2020-10-15 MED ORDER — LIDOCAINE HCL (CARDIAC) PF 100 MG/5ML IV SOSY
PREFILLED_SYRINGE | INTRAVENOUS | Status: DC | PRN
  Administered 2020-10-15: 30 mg via INTRAVENOUS

## 2020-10-15 MED ORDER — ACETAMINOPHEN 325 MG PO TABS: 325.0000 mg | ORAL_TABLET | ORAL | Status: DC | PRN

## 2020-10-15 SURGICAL SUPPLY — 22 items

## 2020-10-15 NOTE — Anesthesia Procedure Notes (Signed)
Date/Time: 10/15/2020 9:46 AM Performed by: Cameron Ali, CRNA Pre-anesthesia Checklist: Patient identified, Emergency Drugs available, Suction available, Timeout performed and Patient being monitored Patient Re-evaluated:Patient Re-evaluated prior to induction Oxygen Delivery Method: Nasal cannula Placement Confirmation: positive ETCO2

## 2020-10-15 NOTE — Anesthesia Postprocedure Evaluation (Signed)
Anesthesia Post Note  Patient: Jessica Mcgee [facility]  Procedure(s) Performed: COLONOSCOPY WITH PROPOFOL     Patient location during evaluation: PACU Anesthesia Type: General Level of consciousness: awake and alert Pain management: pain level controlled Vital Signs Assessment: post-procedure vital signs reviewed and stable Respiratory status: spontaneous breathing, nonlabored ventilation, respiratory function stable and patient connected to nasal cannula oxygen Cardiovascular status: stable and blood pressure returned to baseline Postop Assessment: no apparent nausea or vomiting Anesthetic complications: no   No notable events documented.  Sinda Du

## 2020-10-15 NOTE — Transfer of Care (Signed)
Immediate Anesthesia Transfer of Care Note  Patient: Jessica Mcgee [facility]  Procedure(s) Performed: COLONOSCOPY WITH PROPOFOL  Patient Location: PACU  Anesthesia Type: General  Level of Consciousness: awake, alert  and patient cooperative  Airway and Oxygen Therapy: Patient Spontanous Breathing and Patient connected to supplemental oxygen  Post-op Assessment: Post-op Vital signs reviewed, Patient's Cardiovascular Status Stable, Respiratory Function Stable, Patent Airway and No signs of Nausea or vomiting  Post-op Vital Signs: Reviewed and stable  Complications: No notable events documented.

## 2020-10-15 NOTE — Anesthesia Preprocedure Evaluation (Signed)
Anesthesia Evaluation  Patient identified by MRN, date of birth, ID band Patient awake    Reviewed: Allergy & Precautions, NPO status , Patient's Chart, lab work & pertinent test results  History of Anesthesia Complications (+) PONV and history of anesthetic complications  Airway Mallampati: II  TM Distance: >3 FB Neck ROM: Full    Dental no notable dental hx.    Pulmonary neg pulmonary ROS,    Pulmonary exam normal breath sounds clear to auscultation       Cardiovascular Exercise Tolerance: Good hypertension, Normal cardiovascular exam Rhythm:Regular Rate:Normal     Neuro/Psych  Neuromuscular disease negative psych ROS   GI/Hepatic Neg liver ROS, GERD  ,  Endo/Other  negative endocrine ROS  Renal/GU negative Renal ROS  negative genitourinary   Musculoskeletal  (+) Arthritis , Fibromyalgia -  Abdominal (+) + obese,  Abdomen: soft.    Peds negative pediatric ROS (+)  Hematology negative hematology ROS (+)   Anesthesia Other Findings   Reproductive/Obstetrics negative OB ROS                             Anesthesia Physical Anesthesia Plan  ASA: 3  Anesthesia Plan: General   Post-op Pain Management:    Induction:   PONV Risk Score and Plan: 4 or greater and Propofol infusion and Treatment may vary due to age or medical condition  Airway Management Planned: Mask  Additional Equipment:   Intra-op Plan:   Post-operative Plan:   Informed Consent:     Dental advisory given  Plan Discussed with: CRNA  Anesthesia Plan Comments:         Anesthesia Quick Evaluation  Patient Active Problem List   Diagnosis Date Noted  . Hyperlipidemia, mixed 05/08/2019  . History of Lyme disease 03/31/2019  . Osteoarthritis of both knees 07/04/2017  . 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 GERD (gastroesophageal reflux disease) 10/07/2015  . Essential  hypertension 02/10/2015  . Fibromyalgia 08/25/2014    CBC Latest Ref Rng & Units 05/12/2020 05/07/2019 01/28/2018  WBC 3.4 - 10.8 x10E3/uL 8.8 7.9 9.8  Hemoglobin 11.1 - 15.9 g/dL 13.3 14.3 13.1  Hematocrit 34.0 - 46.6 % 40.4 41.8 38.7  Platelets 150 - 450 x10E3/uL 258 291 274   BMP Latest Ref Rng & Units 05/12/2020 05/07/2019 03/01/2018  Glucose 65 - 99 mg/dL 94 81 90  BUN 8 - 27 mg/dL '10 10 13  '$ Creatinine 0.57 - 1.00 mg/dL 0.66 0.80 0.62  BUN/Creat Ratio 12 - '28 15 13 21  '$ Sodium 134 - 144 mmol/L 140 140 139  Potassium 3.5 - 5.2 mmol/L 3.7 3.6 3.6  Chloride 96 - 106 mmol/L 99 99 93(L)  CO2 20 - 29 mmol/L '24 25 25  '$ Calcium 8.7 - 10.3 mg/dL 9.7 9.9 9.6    Risks and benefits of anesthesia discussed at length, patient or surrogate demonstrates understanding. Appropriately NPO. Plan to proceed with anesthesia.  Champ Mungo, MD 10/15/20

## 2020-10-15 NOTE — H&P (Signed)
Jessica Lame, MD [facility] [address] [place], [place] [address] Phone: [phone] Fax : [phone]  Primary Care Physician:  Glean Hess, MD Primary Gastroenterologist:  Dr. Allen Norris  Pre-Procedure History & Physical: HPI:  Jessica Mcgee is a 70 y.o. female is here for a screening colonoscopy.   Past Medical History:  Diagnosis Date   Bell's palsy    DDD (degenerative disc disease), lumbar    Essential hypertension    Fatigue    Fibromyalgia    GERD (gastroesophageal reflux disease)    Hemangioma of intra-abdominal structures    History of Lyme disease    Hyperlipidemia, mixed    PONV (postoperative nausea and vomiting)    Wears contact lenses    Right eye only    Past Surgical History:  Procedure Laterality Date   COLONOSCOPY     TUBAL LIGATION      Prior to Admission medications   Medication Sig Start Date End Date Taking? Authorizing Provider  Cholecalciferol (VITAMIN D-1000 MAX ST) 25 MCG (1000 UT) tablet Take 1,000 Units by mouth daily.   Yes [provider]  famotidine (PEPCID) 10 MG tablet Take 10 mg by mouth daily as needed for heartburn or indigestion.   Yes [provider]  fluticasone (FLONASE) 50 MCG/ACT nasal spray Place 2 sprays into both nostrils daily. 05/09/17  Yes Cook, Jayce G, DO  hydrochlorothiazide (HYDRODIURIL) 25 MG tablet TAKE 1 TABLET BY MOUTH EVERY DAY 06/21/20  Yes Glean Hess, MD  HYDROcodone-acetaminophen (NORCO/VICODIN) 5-325 MG tablet Take 1 tablet by mouth daily as needed. 06/05/19  Yes [provider]  Milk Thistle 175 MG CAPS Take 1 capsule by mouth in the morning and at bedtime.   Yes [provider]  Multiple Vitamins-Minerals (CENTRUM ADULTS) TABS Take by mouth.   Yes [provider]  potassium chloride (KLOR-CON) 10 MEQ tablet TAKE 1 TABLET BY MOUTH EVERY DAY 06/07/20  Yes Glean Hess, MD  S-Adenosylmethionine (SAM-E PO) Take by mouth.   Yes [provider]     Allergies as of 06/14/2020 - Review Complete 06/14/2020  Allergen Reaction Noted   Cymbalta [duloxetine hcl]  08/25/2014   Latex  07/03/2014    Family History  Problem Relation Age of Onset   Hypertension Mother    Osteoporosis Mother    Breast cancer Mother        ~6   Alcohol abuse Mother    Cancer Mother    Hypertension Father    Heart attack Father    Stroke Father    Heart disease Father    Kidney disease Maternal Grandmother    Osteoporosis Maternal Grandmother    Alcohol abuse Maternal Grandmother    Arthritis Maternal Grandmother    Kidney disease Maternal Grandfather    Cancer Maternal Grandfather    Kidney disease Paternal Grandmother    Cancer Paternal Grandmother    Kidney disease Paternal Grandfather    Breast cancer Paternal Aunt        3 pat aunts   Cancer Paternal Aunt    Cancer Paternal Aunt     Social History   Socioeconomic History   Marital status: Married    Spouse name: Not on file   Number of children: 0   Years of education: Not on file   Highest education level: Not on file  Occupational History   Not on file  Tobacco Use   Smoking status: Never   Smokeless tobacco: Never  Vaping  Use   Vaping Use: Never used  Substance and Sexual Activity   Alcohol use: Yes    Alcohol/week: 4.0 standard drinks    Types: 4 Glasses of wine per week    Comment: occassional   Drug use: No   Sexual activity: Yes    Birth control/protection: Post-menopausal  Other Topics Concern   Not on file  Social History Narrative   Not on file   Social Determinants of Health   Financial Resource Strain: Low Risk    Difficulty of Paying Living Expenses: Not hard at all  Food Insecurity: No Food Insecurity   Worried About Charity fundraiser in the Last Year: Never true   [place] in the Last Year: Never true  Transportation Needs: No Transportation Needs   Lack of Transportation (Medical): No   Lack of Transportation (Non-Medical): No   Physical Activity: Insufficiently Active   Days of Exercise per Week: 4 days   Minutes of Exercise per Session: 20 min  Stress: Stress Concern Present   Feeling of Stress : Very much  Social Connections: Moderately Isolated   Frequency of Communication with Friends and Family: More than three times a week   Frequency of Social Gatherings with Friends and Family: Twice a week   Attends Religious Services: Never   Marine scientist or Organizations: No   Attends Music therapist: Never   Marital Status: Married  Human resources officer Violence: Not At Risk   Fear of Current or Ex-Partner: No   Emotionally Abused: No   Physically Abused: No   Sexually Abused: No    Review of Systems: See HPI, otherwise negative ROS  Physical Exam: BP (!) 168/66   Pulse 78   Ht 5' 1.5" (1.562 m)   Wt 84.4 kg   SpO2 98%   BMI 34.58 kg/m  General:   Alert,  pleasant and cooperative in NAD Head:  Normocephalic and atraumatic. Neck:  Supple; no masses or thyromegaly. Lungs:  Clear throughout to auscultation.    Heart:  Regular rate and rhythm. Abdomen:  Soft, nontender and nondistended. Normal bowel sounds, without guarding, and without rebound.   Neurologic:  Alert and  oriented x4;  grossly normal neurologically.  Impression/Plan: [name] is now here to undergo a screening colonoscopy.  Risks, benefits, and alternatives regarding colonoscopy have been reviewed with the patient.  Questions have been answered.  All parties agreeable.

## 2020-10-15 NOTE — Op Note (Signed)
[facility] Gastroenterology Patient Name: Jessica Mcgee [facility] Procedure Date: 10/15/2020 9:37 AM MRN: [id number] Account #: [id number] Date of Birth: [birth year] Admit Type: Outpatient Age: 70 Room: [facility] OR ROOM 01 Gender: Female Note Status: Finalized Procedure:             Colonoscopy Indications:           Screening for colorectal malignant neoplasm Providers:             [name] MD, MD Referring MD:          Halina Maidens, MD (Referring MD) Medicines:             Propofol per Anesthesia Complications:         No immediate complications. Procedure:             Pre-Anesthesia Assessment:                        - Prior to the procedure, a History and Physical was                         performed, and patient medications and allergies were                         reviewed. The patient's tolerance of previous                         anesthesia was also reviewed. The risks and benefits                         of the procedure and the sedation options and risks                         were discussed with the patient. All questions were                         answered, and informed consent was obtained. Prior                         Anticoagulants: The patient has taken no previous                         anticoagulant or antiplatelet agents. ASA Grade                         Assessment: II - A patient with mild systemic disease.                         After reviewing the risks and benefits, the patient                         was deemed in satisfactory condition to undergo the                         procedure.                        After obtaining informed consent, the colonoscope was  passed under direct vision. Throughout the procedure,                         the patient's blood pressure, pulse, and oxygen                         saturations were monitored continuously. The                         Colonoscope was introduced through the  anus and                         advanced to the the cecum, identified by appendiceal                         orifice and ileocecal valve. The colonoscopy was                         performed without difficulty. The patient tolerated                         the procedure well. The quality of the bowel                         preparation was excellent. Findings:      The perianal and digital rectal examinations were normal.      Multiple small-mouthed diverticula were found in the sigmoid colon.      Non-bleeding internal hemorrhoids were found during retroflexion. The       hemorrhoids were Grade II (internal hemorrhoids that prolapse but reduce       spontaneously). Impression:            - Diverticulosis in the sigmoid colon.                        - Non-bleeding internal hemorrhoids.                        - No specimens collected. Recommendation:        - Discharge patient to home.                        - Resume previous diet.                        - Continue present medications.                        - Repeat colonoscopy in 10 years for screening                         purposes. Procedure Code(s):     --- Professional ---                        [phone], Colonoscopy, flexible; diagnostic, including                         collection of specimen(s) by brushing or washing, when  performed (separate procedure) Diagnosis Code(s):     --- Professional ---                        Z12.11, Encounter for screening for malignant neoplasm                         of colon CPT copyright 2019 American Medical Association. All rights reserved. The codes documented in this report are preliminary and upon coder review may  be revised to meet current compliance requirements. [name] MD, MD 10/15/2020 10:00:04 AM This report has been signed electronically. Number of Addenda: 0 Note Initiated On: 10/15/2020 9:37 AM Scope Withdrawal Time: 0 hours 6 minutes 38 seconds   Total Procedure Duration: 0 hours 10 minutes 32 seconds  Estimated Blood Loss:  Estimated blood loss: none.      [facility]

## 2020-10-18 ENCOUNTER — Encounter: Payer: Self-pay | Admitting: Gastroenterology

## 2020-11-18 DIAGNOSIS — Z79899 Other long term (current) drug therapy: Secondary | ICD-10-CM | POA: Diagnosis not present

## 2020-11-18 DIAGNOSIS — M170 Bilateral primary osteoarthritis of knee: Secondary | ICD-10-CM | POA: Diagnosis not present

## 2020-11-18 DIAGNOSIS — M25562 Pain in left knee: Secondary | ICD-10-CM | POA: Diagnosis not present

## 2020-11-18 DIAGNOSIS — G894 Chronic pain syndrome: Secondary | ICD-10-CM | POA: Diagnosis not present

## 2020-11-18 DIAGNOSIS — M25561 Pain in right knee: Secondary | ICD-10-CM | POA: Diagnosis not present

## 2020-12-06 ENCOUNTER — Other Ambulatory Visit: Payer: Self-pay | Admitting: Internal Medicine

## 2020-12-06 ENCOUNTER — Ambulatory Visit
Admission: RE | Admit: 2020-12-06 | Discharge: 2020-12-06 | Disposition: A | Discharge status: Home / Self Care | Payer: PPO | Source: Ambulatory Visit | Attending: Internal Medicine | Admitting: Internal Medicine

## 2020-12-06 ENCOUNTER — Other Ambulatory Visit: Payer: Self-pay

## 2020-12-06 DIAGNOSIS — Z1231 Encounter for screening mammogram for malignant neoplasm of breast: Secondary | ICD-10-CM | POA: Diagnosis not present

## 2020-12-06 DIAGNOSIS — I10 Essential (primary) hypertension: Secondary | ICD-10-CM

## 2020-12-20 ENCOUNTER — Other Ambulatory Visit: Payer: Self-pay | Admitting: Internal Medicine

## 2020-12-20 DIAGNOSIS — I10 Essential (primary) hypertension: Secondary | ICD-10-CM

## 2020-12-20 NOTE — Telephone Encounter (Signed)
Attempted to call patient to schedule appointment- left message to call office for appointment. Courtesy 30 day RF granted.

## 2021-01-13 ENCOUNTER — Other Ambulatory Visit: Payer: Self-pay | Admitting: Internal Medicine

## 2021-01-13 DIAGNOSIS — I10 Essential (primary) hypertension: Secondary | ICD-10-CM

## 2021-01-13 NOTE — Telephone Encounter (Signed)
Requested Prescriptions  Pending Prescriptions Disp Refills  . hydrochlorothiazide (HYDRODIURIL) 25 MG tablet [Pharmacy Med Name: HYDROCHLOROTHIAZIDE 25 MG TAB] 30 tablet 0    Sig: TAKE 1 TABLET BY MOUTH EVERY DAY     Cardiovascular: Diuretics - Thiazide Failed - 01/13/2021  4:12 AM      Failed - Valid encounter within last 6 months    Recent Outpatient Visits          8 months ago Annual physical exam   [facility] Glean Hess, MD   1 year ago Essential hypertension   [place], Laura H, MD   1 year ago Annual physical exam   [facility] Glean Hess, MD   1 year ago Gastroesophageal reflux disease, unspecified whether esophagitis present   [facility] Glean Hess, MD   2 years ago [place], [place], [place]      Future Appointments            In 1 week Glean Hess, MD [facility], [place]   In 4 months Glean Hess, MD [facility], [place] in normal range and within 360 days    Calcium  Date Value Ref Range Status  05/12/2020 9.7 8.7 - 10.3 mg/dL Final         Passed - Cr in normal range and within 360 days    Creatinine, Ser  Date Value Ref Range Status  05/12/2020 0.66 0.57 - 1.00 mg/dL Final         Passed - K in normal range and within 360 days    Potassium  Date Value Ref Range Status  05/12/2020 3.7 3.5 - 5.2 mmol/L Final         Passed - Na in normal range and within 360 days    Sodium  Date Value Ref Range Status  05/12/2020 140 134 - 144 mmol/L Final         Passed - Last BP in normal range    BP Readings from Last 1 Encounters:  10/15/20 125/69

## 2021-01-20 ENCOUNTER — Encounter: Payer: Self-pay | Admitting: Internal Medicine

## 2021-01-20 ENCOUNTER — Ambulatory Visit (INDEPENDENT_AMBULATORY_CARE_PROVIDER_SITE_OTHER): Payer: PPO | Admitting: Internal Medicine

## 2021-01-20 ENCOUNTER — Other Ambulatory Visit: Payer: Self-pay

## 2021-01-20 VITALS — BP 134/68 | HR 83 | Ht 61.5 in | Wt 191.6 lb

## 2021-01-20 DIAGNOSIS — M65332 Trigger finger, left middle finger: Secondary | ICD-10-CM

## 2021-01-20 DIAGNOSIS — M170 Bilateral primary osteoarthritis of knee: Secondary | ICD-10-CM

## 2021-01-20 DIAGNOSIS — M19041 Primary osteoarthritis, right hand: Secondary | ICD-10-CM | POA: Insufficient documentation

## 2021-01-20 DIAGNOSIS — I10 Essential (primary) hypertension: Secondary | ICD-10-CM

## 2021-01-20 DIAGNOSIS — M19042 Primary osteoarthritis, left hand: Secondary | ICD-10-CM | POA: Insufficient documentation

## 2021-01-20 MED ORDER — HYDROCHLOROTHIAZIDE 25 MG PO TABS: 25.0000 mg | ORAL_TABLET | Freq: Every day | ORAL | 1 refills | Status: DC

## 2021-01-20 MED ORDER — MELOXICAM 15 MG PO TABS: 15.0000 mg | ORAL_TABLET | Freq: Every day | ORAL | 0 refills | Status: DC

## 2021-01-20 NOTE — Progress Notes (Signed)
Date:  01/20/2021   Name:  Jessica Mcgee [facility]   DOB:  1950-04-12   MRN:  [id number]   Chief Complaint: Hypertension  Hypertension This is a new problem. The current episode started more than 1 year ago. The problem is unchanged (122/75). The problem is controlled. Pertinent negatives include no chest pain, headaches, palpitations or shortness of breath.  Knee Pain  There was no injury mechanism. The pain is present in the left knee and right knee. The quality of the pain is described as aching. The pain has been Fluctuating since onset. The symptoms are aggravated by weight bearing.  Hand Pain  There was no injury mechanism. The pain is present in the left fingers and right fingers. The quality of the pain is described as aching and cramping. The pain is mild. The pain has been Fluctuating since the incident. Pertinent negatives include no chest pain. She has tried NSAIDs (Advil and Aleve - minimal benefit) for the symptoms.  Trigger finger - she has triggering of the middle finger on the left hand.  It is uncomfortable but she is able to straighten it.  This has never been evaluated by Ortho. She is busy with her in-laws home sale, her husband's current health issues right now and does not want to be referred yet.  Lab Results  Component Value Date   CREATININE 0.66 05/12/2020   BUN 10 05/12/2020   NA 140 05/12/2020   K 3.7 05/12/2020   CL 99 05/12/2020   CO2 24 05/12/2020   Lab Results  Component Value Date   CHOL 201 (H) 05/12/2020   HDL 77 05/12/2020   LDLCALC 111 (H) 05/12/2020   TRIG 70 05/12/2020   CHOLHDL 2.6 05/12/2020   Lab Results  Component Value Date   TSH 2.310 05/12/2020   No results found for: HGBA1C Lab Results  Component Value Date   WBC 8.8 05/12/2020   HGB 13.3 05/12/2020   HCT 40.4 05/12/2020   MCV 89 05/12/2020   PLT 258 05/12/2020   Lab Results  Component Value Date   ALT 18 05/12/2020   AST 18 05/12/2020   ALKPHOS 55 05/12/2020   BILITOT 0.6  05/12/2020   No components found for: VITD  Review of Systems  Constitutional:  Negative for chills, fatigue, fever and unexpected weight change.  HENT:  Negative for nosebleeds.   Eyes:  Negative for visual disturbance.  Respiratory:  Negative for cough, chest tightness, shortness of breath and wheezing.   Cardiovascular:  Negative for chest pain, palpitations and leg swelling.  Gastrointestinal:  Negative for abdominal pain, constipation and diarrhea.  Musculoskeletal:  Positive for arthralgias and myalgias.  Neurological:  Negative for dizziness, weakness, light-headedness and headaches.  Psychiatric/Behavioral:  Negative for dysphoric mood and sleep disturbance. The patient is not nervous/anxious.    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   GERD (gastroesophageal reflux disease) 10/07/2015   Essential hypertension 02/10/2015   Fibromyalgia 08/25/2014    Allergies  Allergen Reactions   Cymbalta [Duloxetine Hcl]     Marked fatigue   Pineapple Swelling    Tongue swelling, mouth ulcers   Latex Rash    (With extended contact)    Past Surgical History:  Procedure Laterality Date   COLONOSCOPY  COLONOSCOPY WITH PROPOFOL N/A 10/15/2020   Procedure: COLONOSCOPY WITH PROPOFOL;  Surgeon: [name], MD;  Location: [place];  Service: Endoscopy;  Laterality: N/A;  Latex   TUBAL LIGATION      Social History   Tobacco Use   Smoking status: Never   Smokeless tobacco: Never  Vaping Use   Vaping Use: Never used  Substance Use Topics   Alcohol use: Yes    Alcohol/week: 4.0 standard drinks    Types: 4 Glasses of wine per week    Comment: occassional   Drug use: No     Medication list has been reviewed and updated.  Current Meds   Medication Sig   Cholecalciferol (VITAMIN D-1000 MAX ST) 25 MCG (1000 UT) tablet Take 1,000 Units by mouth daily.   famotidine (PEPCID) 10 MG tablet Take 10 mg by mouth daily as needed for heartburn or indigestion.   hydrochlorothiazide (HYDRODIURIL) 25 MG tablet TAKE 1 TABLET BY MOUTH EVERY DAY   Milk Thistle 175 MG CAPS Take 1 capsule by mouth in the morning and at bedtime.   Multiple Vitamins-Minerals (CENTRUM ADULTS) TABS Take by mouth.   potassium chloride (KLOR-CON) 10 MEQ tablet TAKE 1 TABLET BY MOUTH EVERY DAY   S-Adenosylmethionine (SAM-E PO) Take by mouth.    PHQ 2/9 Scores 01/20/2021 01/20/2021 07/26/2020 05/12/2020  PHQ - 2 Score 2 0 1 0  PHQ- 9 Score 7 0 2 0    GAD 7 : Generalized Anxiety Score 01/20/2021 01/20/2021 05/12/2020 01/28/2018  Nervous, Anxious, on Edge 1 0 0 0  Control/stop worrying 1 0 0 0  Worry too much - different things 1 0 0 0  Trouble relaxing 1 0 0 0  Restless 1 0 0 0  Easily annoyed or irritable 1 0 0 0  Afraid - awful might happen 1 0 0 0  Total GAD 7 Score 7 0 0 0  Anxiety Difficulty Not difficult at all Not difficult at all Not difficult at all -    BP Readings from Last 3 Encounters:  01/20/21 134/68  10/15/20 125/69  05/12/20 126/84    Physical Exam Vitals and nursing note reviewed.  Constitutional:      General: She is not in acute distress.    Appearance: Normal appearance. She is well-developed.  HENT:     Head: Normocephalic and atraumatic.  Neck:     Vascular: No carotid bruit.  Cardiovascular:     Rate and Rhythm: Normal rate and regular rhythm.     Pulses: Normal pulses.  Pulmonary:     Effort: Pulmonary effort is normal. No respiratory distress.     Breath sounds: No wheezing or rhonchi.  Musculoskeletal:     Right hand: Bony tenderness present.     Left hand: Bony tenderness present.     Cervical back: Normal range of motion.     Right knee: No effusion or crepitus. Normal range of motion.     Left knee: No effusion or  crepitus. Normal range of motion.     Comments: PIP joint swelling without synovitis Triggering left middle finger  Lymphadenopathy:     Cervical: No cervical adenopathy.  Skin:    General: Skin is warm and dry.     Findings: No rash.  Neurological:     Mental Status: She is alert and oriented to person, place, and time.  Psychiatric:        Mood and Affect: Mood normal.        Behavior:  Behavior normal.    Wt Readings from Last 3 Encounters:  01/20/21 191 lb 9.6 oz (86.9 kg)  10/15/20 186 lb (84.4 kg)  05/12/20 184 lb (83.5 kg)    BP 134/68   Pulse 83   Ht 5' 1.5" (1.562 m)   Wt 191 lb 9.6 oz (86.9 kg)   SpO2 98%   BMI 35.62 kg/m   Assessment and Plan: 1. Essential hypertension Clinically stable exam with well controlled BP. Tolerating medications without side effects at this time. Pt to continue current regimen and low sodium diet; benefits of regular exercise as able discussed. - hydrochlorothiazide (HYDRODIURIL) 25 MG tablet; Take 1 tablet (25 mg total) by mouth daily.  Dispense: 90 tablet; Refill: 1  2. Trigger middle finger of left hand Can refer to Ortho when it becomes more symptomatic  3. Primary osteoarthritis of both hands Trial of Meloxicam daily - meloxicam (MOBIC) 15 MG tablet; Take 1 tablet (15 mg total) by mouth daily.  Dispense: 30 tablet; Refill: 0  4. Primary osteoarthritis of both knees May be a candidate for visco-supplementation but will need Ortho eval. She will call back for referral when desired. Mobic may be beneficial in the meantime.   Partially dictated using Editor, commissioning. Any errors are unintentional.  Halina Maidens, MD [facility]  01/20/2021

## 2021-02-13 ENCOUNTER — Other Ambulatory Visit: Payer: Self-pay | Admitting: Internal Medicine

## 2021-02-13 DIAGNOSIS — M19041 Primary osteoarthritis, right hand: Secondary | ICD-10-CM

## 2021-02-13 NOTE — Telephone Encounter (Signed)
Requested Prescriptions  Pending Prescriptions Disp Refills  . meloxicam (MOBIC) 15 MG tablet [Pharmacy Med Name: MELOXICAM 15 MG TABLET] 30 tablet 0    Sig: TAKE 1 TABLET (15 MG TOTAL) BY MOUTH DAILY.     Analgesics:  COX2 Inhibitors Passed - 02/13/2021 12:50 AM      Passed - HGB in normal range and within 360 days    Hemoglobin  Date Value Ref Range Status  05/12/2020 13.3 11.1 - 15.9 g/dL Final         Passed - Cr in normal range and within 360 days    Creatinine, Ser  Date Value Ref Range Status  05/12/2020 0.66 0.57 - 1.00 mg/dL Final         Passed - Patient is not pregnant      Passed - Valid encounter within last 12 months    Recent Outpatient Visits          3 weeks ago Trigger middle finger of left hand   [facility] Glean Hess, MD   9 months ago Annual physical exam   [facility] Glean Hess, MD   1 year ago Essential hypertension   [facility] Glean Hess, MD   1 year ago Annual physical exam   [facility] Glean Hess, MD   1 year ago Gastroesophageal reflux disease, unspecified whether esophagitis present   [facility] Glean Hess, MD      Future Appointments            In 3 months Army Melia Jesse Sans, MD [facility], [facility]

## 2021-03-14 ENCOUNTER — Other Ambulatory Visit: Payer: Self-pay | Admitting: Internal Medicine

## 2021-03-14 DIAGNOSIS — M19042 Primary osteoarthritis, left hand: Secondary | ICD-10-CM

## 2021-03-14 DIAGNOSIS — M19041 Primary osteoarthritis, right hand: Secondary | ICD-10-CM

## 2021-03-14 NOTE — Telephone Encounter (Signed)
Requested Prescriptions  Pending Prescriptions Disp Refills   meloxicam (MOBIC) 15 MG tablet [Pharmacy Med Name: MELOXICAM 15 MG TABLET] 30 tablet 1    Sig: TAKE 1 TABLET (15 MG TOTAL) BY MOUTH DAILY.     Analgesics:  COX2 Inhibitors Passed - 03/14/2021  1:38 AM      Passed - HGB in normal range and within 360 days    Hemoglobin  Date Value Ref Range Status  05/12/2020 13.3 11.1 - 15.9 g/dL Final         Passed - Cr in normal range and within 360 days    Creatinine, Ser  Date Value Ref Range Status  05/12/2020 0.66 0.57 - 1.00 mg/dL Final         Passed - Patient is not pregnant      Passed - Valid encounter within last 12 months    Recent Outpatient Visits          1 month ago Trigger middle finger of left hand   [facility] Glean Hess, MD   10 months ago Annual physical exam   [facility] Glean Hess, MD   1 year ago Essential hypertension   [facility] Glean Hess, MD   1 year ago Annual physical exam   [facility] Glean Hess, MD   1 year ago Gastroesophageal reflux disease, unspecified whether esophagitis present   [facility] Glean Hess, MD      Future Appointments            In 2 months Army Melia Jesse Sans, MD [facility], [facility]

## 2021-03-17 IMAGING — US US EXTREM LOW VENOUS*R*
1 series · 13 of 24 positions shown · non-contrast
Comparison: None.

CLINICAL DATA: Right lower extremity leg swelling for 1 day



[Series 1: us venous img lower uni right (dvt) · portal-venous · 13 of 33 slices shown]
[im 1/33]
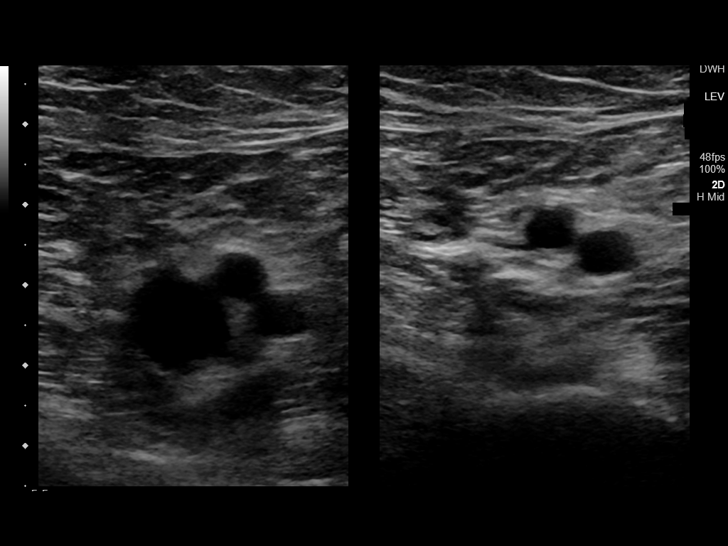
[im 3/33]
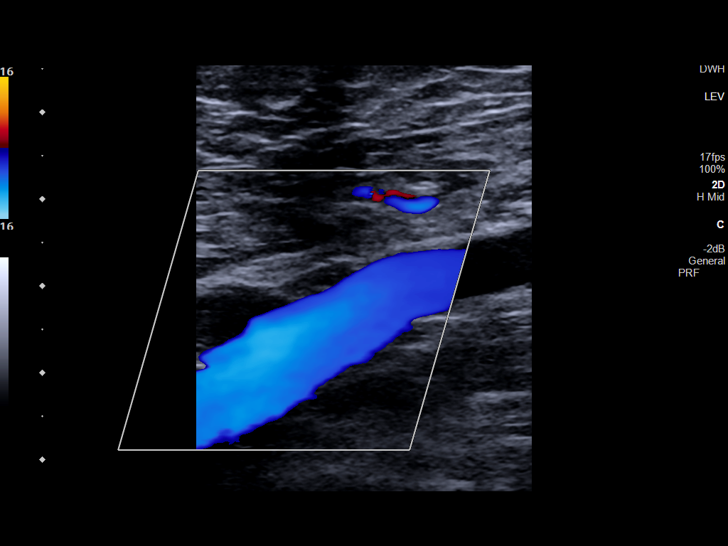
[im 6/33]
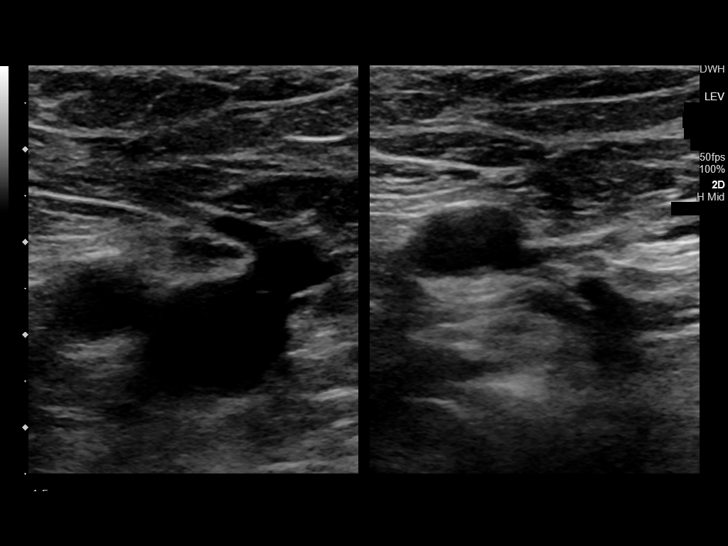
[im 9/33]
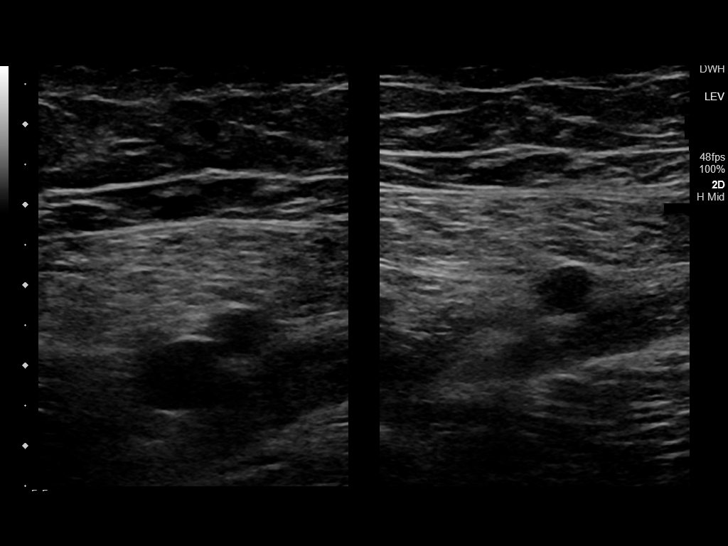
[im 12/33]
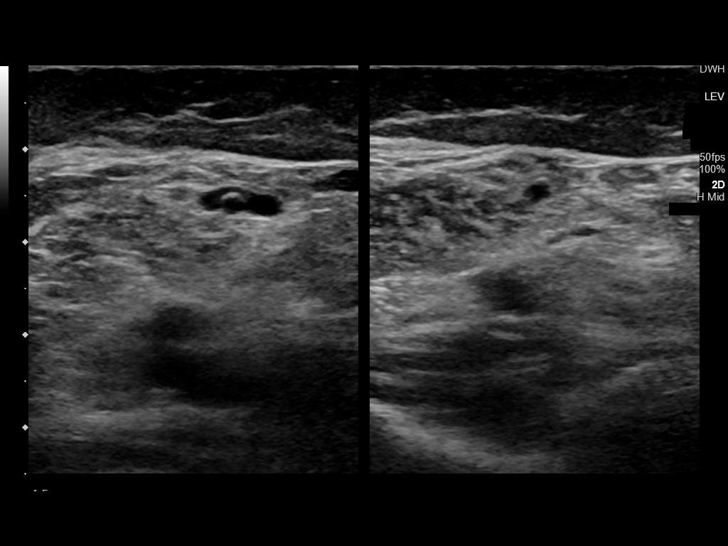
[im 14/33]
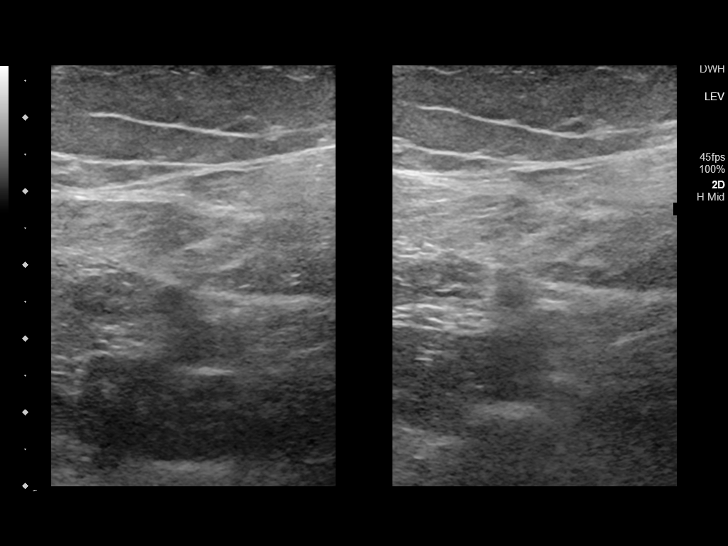
[im 17/33]
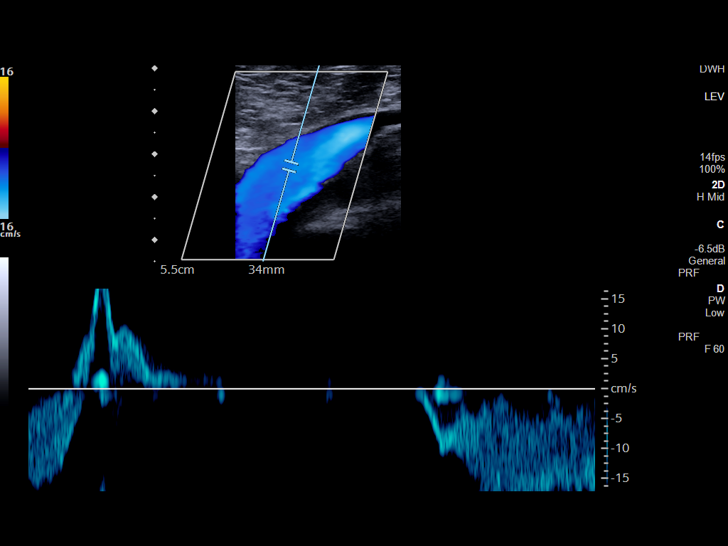
[im 19/33]
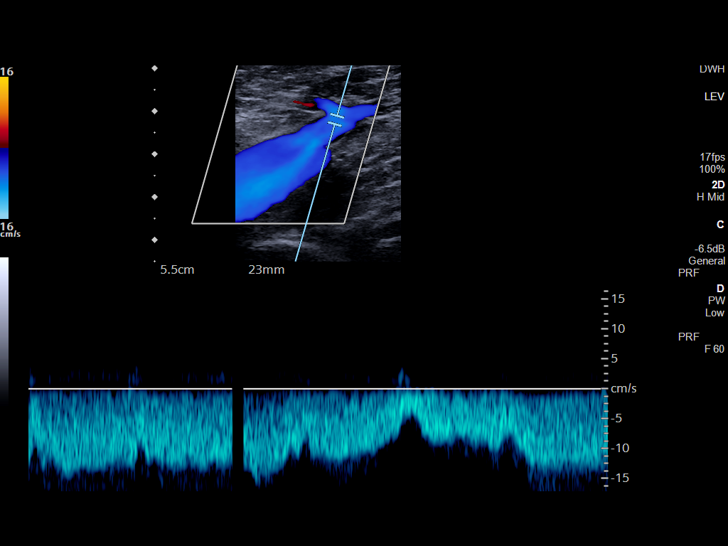
[im 21/33]
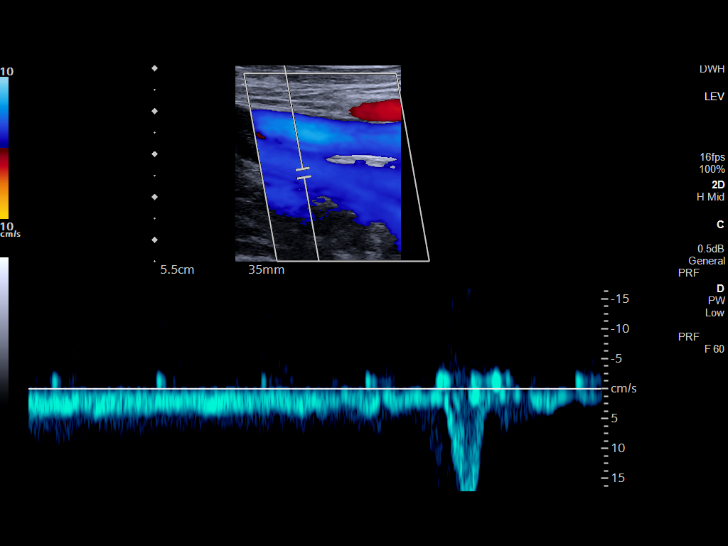
[im 24/33]
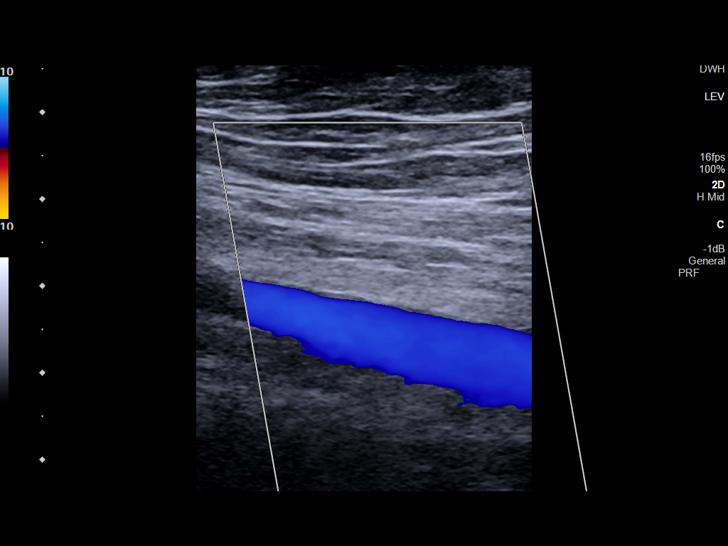
[im 27/33]
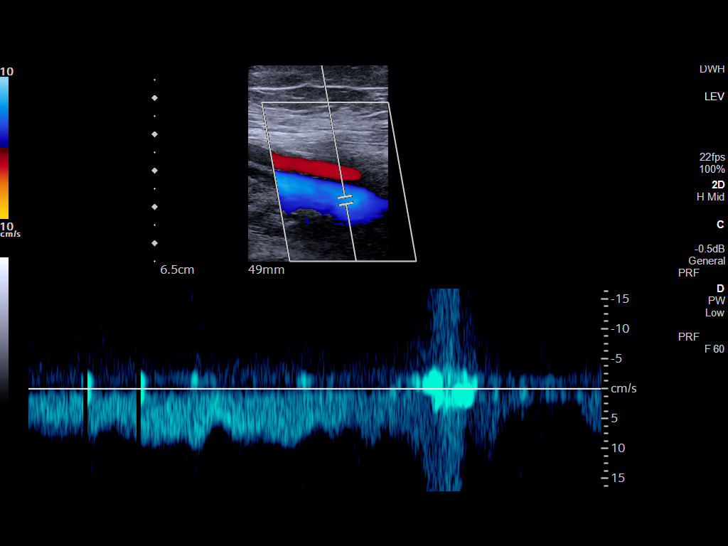
[im 30/33]
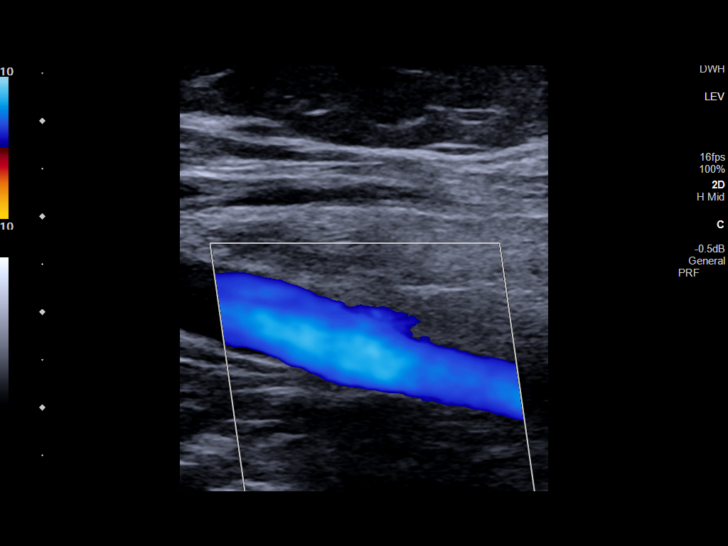
[im 33/33]
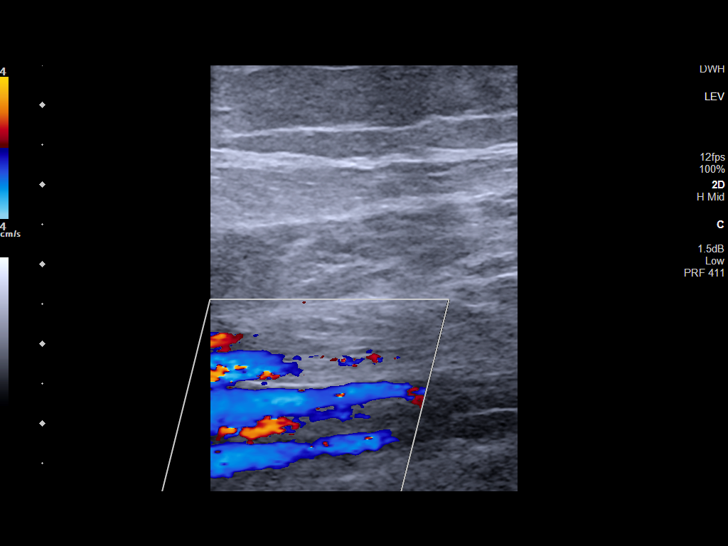

[13 of 24 positions shown; findings below may reference images not displayed]

FINDINGS: Contralateral Common Femoral Vein: Respiratory phasicity is normal
and symmetric with the symptomatic side. No evidence of thrombus.
Normal compressibility.

Common Femoral Vein: No evidence of thrombus. Normal
compressibility, respiratory phasicity and response to augmentation.

Saphenofemoral Junction: No evidence of thrombus. Normal
compressibility and flow on color Doppler imaging.

Profunda Femoral Vein: No evidence of thrombus. Normal
compressibility and flow on color Doppler imaging.

Femoral Vein: No evidence of thrombus. Normal compressibility,
respiratory phasicity and response to augmentation.

Popliteal Vein: No evidence of thrombus. Normal compressibility,
respiratory phasicity and response to augmentation.

Calf Veins: No evidence of thrombus. Normal compressibility and flow
on color Doppler imaging.

Superficial Great Saphenous Vein: No evidence of thrombus. Normal
compressibility.

Venous Reflux:  None.

Other Findings:  None.
IMPRESSION: No evidence of deep venous thrombosis.

## 2021-04-07 DIAGNOSIS — Z79891 Long term (current) use of opiate analgesic: Secondary | ICD-10-CM | POA: Diagnosis not present

## 2021-04-07 DIAGNOSIS — Z79899 Other long term (current) drug therapy: Secondary | ICD-10-CM | POA: Diagnosis not present

## 2021-04-07 DIAGNOSIS — G894 Chronic pain syndrome: Secondary | ICD-10-CM | POA: Diagnosis not present

## 2021-04-07 DIAGNOSIS — M170 Bilateral primary osteoarthritis of knee: Secondary | ICD-10-CM | POA: Diagnosis not present

## 2021-04-07 DIAGNOSIS — M25562 Pain in left knee: Secondary | ICD-10-CM | POA: Diagnosis not present

## 2021-04-07 DIAGNOSIS — M25561 Pain in right knee: Secondary | ICD-10-CM | POA: Diagnosis not present

## 2021-05-09 ENCOUNTER — Other Ambulatory Visit: Payer: Self-pay | Admitting: Internal Medicine

## 2021-05-09 DIAGNOSIS — M19041 Primary osteoarthritis, right hand: Secondary | ICD-10-CM

## 2021-05-10 NOTE — Telephone Encounter (Signed)
Requested Prescriptions  ?Pending Prescriptions Disp Refills  ?? meloxicam (MOBIC) 15 MG tablet [Pharmacy Med Name: MELOXICAM 15 MG TABLET] 30 tablet 1  ?  Sig: TAKE 1 TABLET (15 MG TOTAL) BY MOUTH DAILY.  ?  ? Analgesics:  COX2 Inhibitors Failed - 05/09/2021  1:57 AM  ?  ?  Failed - Manual Review: Labs are only required if the patient has taken medication for more than 8 weeks.  ?  ?  Failed - HGB in normal range and within 360 days  ?  Hemoglobin  ?Date Value Ref Range Status  ?05/12/2020 13.3 11.1 - 15.9 g/dL Final  ?   ?  ?  Failed - Cr in normal range and within 360 days  ?  Creatinine, Ser  ?Date Value Ref Range Status  ?05/12/2020 0.66 0.57 - 1.00 mg/dL Final  ?   ?  ?  Failed - HCT in normal range and within 360 days  ?  Hematocrit  ?Date Value Ref Range Status  ?05/12/2020 40.4 34.0 - 46.6 % Final  ?   ?  ?  Failed - AST in normal range and within 360 days  ?  AST  ?Date Value Ref Range Status  ?05/12/2020 18 0 - 40 IU/L Final  ?   ?  ?  Failed - ALT in normal range and within 360 days  ?  ALT  ?Date Value Ref Range Status  ?05/12/2020 18 0 - 32 IU/L Final  ?   ?  ?  Failed - eGFR is 30 or above and within 360 days  ?  GFR calc Af Amer  ?Date Value Ref Range Status  ?05/07/2019 89 >59 mL/min/1.73 Final  ? ?GFR calc non Af Amer  ?Date Value Ref Range Status  ?05/07/2019 77 >59 mL/min/1.73 Final  ? ?eGFR  ?Date Value Ref Range Status  ?05/12/2020 96 >59 mL/min/1.73 Final  ?   ?  ?  Passed - Patient is not pregnant  ?  ?  Passed - Valid encounter within last 12 months  ?  Recent Outpatient Visits   ?      ? 3 months ago Trigger middle finger of left hand  ? [facility] Glean Hess, MD  ? 12 months ago Annual physical exam  ? [facility] Glean Hess, MD  ? 1 year ago Essential hypertension  ? [facility] Glean Hess, MD  ? 2 years ago Annual physical exam  ? [facility] Glean Hess, MD  ? 2 years ago Gastroesophageal reflux disease, unspecified  whether esophagitis present  ? [facility] Glean Hess, MD  ?  ?  ?Future Appointments   ?        ? In 1 week Glean Hess, MD [facility], [place]  ?  ? ?  ?  ?  ? ?

## 2021-05-18 ENCOUNTER — Encounter: Payer: PPO | Admitting: Internal Medicine

## 2021-06-12 ENCOUNTER — Other Ambulatory Visit: Payer: Self-pay | Admitting: Internal Medicine

## 2021-06-12 DIAGNOSIS — I10 Essential (primary) hypertension: Secondary | ICD-10-CM

## 2021-06-13 NOTE — Telephone Encounter (Signed)
Requested medication (s) are due for refill today: Yes ? ?Requested medication (s) are on the active medication list: Yes ? ?Last refill:  12/06/20 ? ?Future visit scheduled: Yes ? ?Notes to clinic:  Protocol indicates pt. Needs lab work. ? ? ? ?Requested Prescriptions  ?Pending Prescriptions Disp Refills  ? potassium chloride (KLOR-CON) 10 MEQ tablet [Pharmacy Med Name: POTASSIUM CL ER 10 MEQ TABLET] 90 tablet 1  ?  Sig: TAKE 1 TABLET BY MOUTH EVERY DAY  ?  ? Endocrinology:  Minerals - Potassium Supplementation Failed - 06/12/2021  1:14 AM  ?  ?  Failed - K in normal range and within 360 days  ?  Potassium  ?Date Value Ref Range Status  ?05/12/2020 3.7 3.5 - 5.2 mmol/L Final  ?  ?  ?  ?  Failed - Cr in normal range and within 360 days  ?  Creatinine, Ser  ?Date Value Ref Range Status  ?05/12/2020 0.66 0.57 - 1.00 mg/dL Final  ?  ?  ?  ?  Passed - Valid encounter within last 12 months  ?  Recent Outpatient Visits   ? ?      ? 4 months ago Trigger middle finger of left hand  ? [facility] Glean Hess, MD  ? 1 year ago Annual physical exam  ? [facility] Glean Hess, MD  ? 1 year ago Essential hypertension  ? [facility] Glean Hess, MD  ? 2 years ago Annual physical exam  ? [facility] Glean Hess, MD  ? 2 years ago Gastroesophageal reflux disease, unspecified whether esophagitis present  ? [facility] Glean Hess, MD  ? ?  ?  ?Future Appointments   ? ?        ? In 1 month Army Melia Jesse Sans, MD [facility], [place]  ? ?  ? ?  ?  ?  ? ?

## 2021-07-10 ENCOUNTER — Other Ambulatory Visit: Payer: Self-pay | Admitting: Internal Medicine

## 2021-07-10 DIAGNOSIS — I10 Essential (primary) hypertension: Secondary | ICD-10-CM

## 2021-07-11 NOTE — Telephone Encounter (Signed)
Rx 06/13/21 #30 1RF-too soon ?Requested Prescriptions  ?Pending Prescriptions Disp Refills  ?? potassium chloride (KLOR-CON) 10 MEQ tablet [Pharmacy Med Name: POTASSIUM CL ER 10 MEQ TABLET] 30 tablet 1  ?  Sig: TAKE 1 TABLET BY MOUTH EVERY DAY  ?  ? Endocrinology:  Minerals - Potassium Supplementation Failed - 07/10/2021 12:32 PM  ?  ?  Failed - K in normal range and within 360 days  ?  Potassium  ?Date Value Ref Range Status  ?05/12/2020 3.7 3.5 - 5.2 mmol/L Final  ?   ?  ?  Failed - Cr in normal range and within 360 days  ?  Creatinine, Ser  ?Date Value Ref Range Status  ?05/12/2020 0.66 0.57 - 1.00 mg/dL Final  ?   ?  ?  Passed - Valid encounter within last 12 months  ?  Recent Outpatient Visits   ?      ? 5 months ago Trigger middle finger of left hand  ? [facility] Glean Hess, MD  ? 1 year ago Annual physical exam  ? [facility] Glean Hess, MD  ? 1 year ago Essential hypertension  ? [facility] Glean Hess, MD  ? 2 years ago Annual physical exam  ? [facility] Glean Hess, MD  ? 2 years ago Gastroesophageal reflux disease, unspecified whether esophagitis present  ? [facility] Glean Hess, MD  ?  ?  ?Future Appointments   ?        ? In 2 weeks Army Melia Jesse Sans, MD [facility], [place]  ?  ? ?  ?  ?  ? ?

## 2021-07-13 ENCOUNTER — Telehealth: Payer: Self-pay

## 2021-07-13 NOTE — Telephone Encounter (Signed)
Told pt that we could not excuse her for jury duty. Told her she would have to reach out to [facility]. She verbalized understanding after asking for [place] to excuse her numerous times. ? ?KP ?

## 2021-07-13 NOTE — Telephone Encounter (Signed)
Copied from [place] [phone]. Topic: General - Other ?>> Jul 13, 2021 12:52 PM Alanda Slim E wrote: ?Reason for CRM: Pt received notice of Jury Duty for June 7th / pt would like a letter from Dr. Army Melia excusing her from this due to arthritis and fibromyalgia / pt is not able to sit for long ?

## 2021-07-25 DIAGNOSIS — Z79899 Other long term (current) drug therapy: Secondary | ICD-10-CM | POA: Diagnosis not present

## 2021-07-25 DIAGNOSIS — G894 Chronic pain syndrome: Secondary | ICD-10-CM | POA: Diagnosis not present

## 2021-07-25 DIAGNOSIS — M170 Bilateral primary osteoarthritis of knee: Secondary | ICD-10-CM | POA: Diagnosis not present

## 2021-07-27 ENCOUNTER — Other Ambulatory Visit: Payer: Self-pay | Admitting: Internal Medicine

## 2021-07-27 ENCOUNTER — Ambulatory Visit (INDEPENDENT_AMBULATORY_CARE_PROVIDER_SITE_OTHER): Payer: PPO | Admitting: Internal Medicine

## 2021-07-27 ENCOUNTER — Encounter: Payer: Self-pay | Admitting: Internal Medicine

## 2021-07-27 VITALS — BP 127/80 | HR 78 | Ht 61.5 in | Wt 190.0 lb

## 2021-07-27 DIAGNOSIS — E782 Mixed hyperlipidemia: Secondary | ICD-10-CM

## 2021-07-27 DIAGNOSIS — Z1231 Encounter for screening mammogram for malignant neoplasm of breast: Secondary | ICD-10-CM | POA: Diagnosis not present

## 2021-07-27 DIAGNOSIS — M19042 Primary osteoarthritis, left hand: Secondary | ICD-10-CM | POA: Diagnosis not present

## 2021-07-27 DIAGNOSIS — K219 Gastro-esophageal reflux disease without esophagitis: Secondary | ICD-10-CM | POA: Diagnosis not present

## 2021-07-27 DIAGNOSIS — Z0000 Encounter for general adult medical examination without abnormal findings: Secondary | ICD-10-CM

## 2021-07-27 DIAGNOSIS — D1803 Hemangioma of intra-abdominal structures: Secondary | ICD-10-CM

## 2021-07-27 DIAGNOSIS — M19041 Primary osteoarthritis, right hand: Secondary | ICD-10-CM | POA: Diagnosis not present

## 2021-07-27 DIAGNOSIS — K769 Liver disease, unspecified: Secondary | ICD-10-CM

## 2021-07-27 DIAGNOSIS — I10 Essential (primary) hypertension: Secondary | ICD-10-CM | POA: Diagnosis not present

## 2021-07-27 MED ORDER — NABUMETONE 500 MG PO TABS: 500.0000 mg | ORAL_TABLET | Freq: Two times a day (BID) | ORAL | 0 refills | Status: DC

## 2021-07-27 MED ORDER — HYDROCHLOROTHIAZIDE 25 MG PO TABS: 25.0000 mg | ORAL_TABLET | Freq: Every day | ORAL | 1 refills | Status: DC

## 2021-07-27 NOTE — Progress Notes (Signed)
Date:  07/27/2021   Name:  Jessica Mcgee [facility]   DOB:  23-Oct-1950   MRN:  [id number]   Chief Complaint: Annual Exam Jessica Mcgee is a 70 y.o. female who presents today for her Complete Annual Exam. She feels fairly well. She reports exercising - not at this time due to pain. She reports she is sleeping fairly well. Breast complaints - none.  Mammogram: 12/2020 DEXA: 11/2018 normal Pap smear: 05/2020 neg thin prep Colonoscopy: 10/2020  Health Maintenance Due  Topic Date Due   Zoster Vaccines- Shingrix (1 of 2) Never done   Pneumonia Vaccine 56+ Years old (1 - PCV) Never done   COVID-19 Vaccine (3 - Pfizer risk series) 08/15/2019    Immunization History  Administered Date(s) Administered   PFIZER(Purple Top)SARS-COV-2 Vaccination 06/06/2019, 07/18/2019   Td 01/28/2014    Hypertension This is a chronic problem. The problem is controlled. Pertinent negatives include no chest pain, headaches, palpitations or shortness of breath. Past treatments include diuretics. The current treatment provides significant improvement.  Hyperlipidemia This is a chronic problem. The problem is uncontrolled. Associated symptoms include myalgias. Pertinent negatives include no chest pain or shortness of breath. Current antihyperlipidemic treatment includes diet change.  Gastroesophageal Reflux She complains of heartburn. She reports no abdominal pain, no chest pain, no choking, no coughing, no dysphagia, no water brash or no wheezing. This is a recurrent problem. The problem occurs occasionally. Pertinent negatives include no fatigue. She has tried a histamine-2 antagonist for the symptoms. The treatment provided significant relief.  OS hands - taking 7.5 mg of Mobic most days for hand pain.  She has more gerd and has tried to adjust the timing of the Mobic.  She has tried Cymbalta for pain but no other nsaid besides Advil.  Lab Results  Component Value Date   NA 140 05/12/2020   K 3.7 05/12/2020   CO2 24  05/12/2020   GLUCOSE 94 05/12/2020   BUN 10 05/12/2020   CREATININE 0.66 05/12/2020   CALCIUM 9.7 05/12/2020   EGFR 96 05/12/2020   GFRNONAA 77 05/07/2019   Lab Results  Component Value Date   CHOL 201 (H) 05/12/2020   HDL 77 05/12/2020   LDLCALC 111 (H) 05/12/2020   TRIG 70 05/12/2020   CHOLHDL 2.6 05/12/2020   Lab Results  Component Value Date   TSH 2.310 05/12/2020   No results found for: HGBA1C Lab Results  Component Value Date   WBC 8.8 05/12/2020   HGB 13.3 05/12/2020   HCT 40.4 05/12/2020   MCV 89 05/12/2020   PLT 258 05/12/2020   Lab Results  Component Value Date   ALT 18 05/12/2020   AST 18 05/12/2020   ALKPHOS 55 05/12/2020   BILITOT 0.6 05/12/2020   Lab Results  Component Value Date   VD25OH 41.9 08/25/2014     Review of Systems  Constitutional:  Negative for chills, fatigue and fever.  HENT:  Negative for congestion, hearing loss, tinnitus, trouble swallowing and voice change.   Eyes:  Negative for visual disturbance.  Respiratory:  Negative for cough, choking, chest tightness, shortness of breath and wheezing.   Cardiovascular:  Negative for chest pain, palpitations and leg swelling.  Gastrointestinal:  Positive for heartburn. Negative for abdominal pain, constipation, diarrhea, dysphagia and vomiting.  Endocrine: Negative for polydipsia and polyuria.  Genitourinary:  Negative for dysuria, frequency, genital sores, vaginal bleeding and vaginal discharge.  Musculoskeletal:  Positive for arthralgias and myalgias. Negative for gait problem and  joint swelling.  Skin:  Negative for color change and rash.  Neurological:  Negative for dizziness, tremors, light-headedness and headaches.  Hematological:  Negative for adenopathy. Does not bruise/bleed easily.  Psychiatric/Behavioral:  Negative for dysphoric mood and sleep disturbance. The patient is not nervous/anxious.    Patient Active Problem List   Diagnosis Date Noted   Trigger middle finger of left  hand 01/20/2021   Primary osteoarthritis of both hands 01/20/2021   Encounter for screening colonoscopy    Hyperlipidemia, mixed 05/08/2019   History of Lyme disease 03/31/2019   Osteoarthritis of both knees 07/04/2017   Hemangioma of intra-abdominal structures 04/19/2016   GERD (gastroesophageal reflux disease) 10/07/2015   Essential hypertension 02/10/2015   Fibromyalgia 08/25/2014    Allergies  Allergen Reactions   Cymbalta [Duloxetine Hcl]     Marked fatigue   Pineapple Swelling    Tongue swelling, mouth ulcers   Latex Rash    (With extended contact)    Past Surgical History:  Procedure Laterality Date   COLONOSCOPY     COLONOSCOPY WITH PROPOFOL N/A 10/15/2020   Procedure: COLONOSCOPY WITH PROPOFOL;  Surgeon: [name], MD;  Location: [place];  Service: Endoscopy;  Laterality: N/A;  Latex   TUBAL LIGATION      Social History   Tobacco Use   Smoking status: Never   Smokeless tobacco: Never  Vaping Use   Vaping Use: Never used  Substance Use Topics   Alcohol use: Yes    Alcohol/week: 4.0 standard drinks    Types: 4 Glasses of wine per week    Comment: occassional   Drug use: No     Medication list has been reviewed and updated.  Current Meds  Medication Sig   Cholecalciferol (VITAMIN D-1000 MAX ST) 25 MCG (1000 UT) tablet Take 1,000 Units by mouth daily.   famotidine (PEPCID) 10 MG tablet Take 10 mg by mouth daily as needed for heartburn or indigestion.   hydrochlorothiazide (HYDRODIURIL) 25 MG tablet Take 1 tablet (25 mg total) by mouth daily.   HYDROcodone-acetaminophen (NORCO/VICODIN) 5-325 MG tablet Take 1 tablet by mouth every 4 (four) hours as needed.   meloxicam (MOBIC) 15 MG tablet TAKE 1 TABLET (15 MG TOTAL) BY MOUTH DAILY. (Patient taking differently: Take 7.5 mg by mouth daily.)   Milk Thistle 175 MG CAPS Take 1 capsule by mouth in the morning and at bedtime.   Multiple Vitamins-Minerals (CENTRUM ADULTS) TABS Take by mouth.    potassium chloride (KLOR-CON) 10 MEQ tablet TAKE 1 TABLET BY MOUTH EVERY DAY   S-Adenosylmethionine (SAM-E PO) Take by mouth.       07/27/2021   10:55 AM 01/20/2021   11:11 AM 01/20/2021   11:07 AM 05/12/2020   10:07 AM  GAD 7 : Generalized Anxiety Score  Nervous, Anxious, on Edge 1 1 0 0  Control/stop worrying 1 1 0 0  Worry too much - different things 1 1 0 0  Trouble relaxing 1 1 0 0  Restless 1 1 0 0  Easily annoyed or irritable 2 1 0 0  Afraid - awful might happen 1 1 0 0  Total GAD 7 Score 8 7 0 0  Anxiety Difficulty Somewhat difficult Not difficult at all Not difficult at all Not difficult at all       07/27/2021   10:55 AM  Depression screen PHQ 2/9  Decreased Interest 2  Down, Depressed, Hopeless 2  PHQ - 2 Score 4  Altered sleeping 2  Tired,  decreased energy 2  Change in appetite 2  Feeling bad or failure about yourself  1  Trouble concentrating 1  Moving slowly or fidgety/restless 0  Suicidal thoughts 1  PHQ-9 Score 13  Difficult doing work/chores Somewhat difficult    BP Readings from Last 3 Encounters:  07/27/21 127/80  01/20/21 134/68  10/15/20 125/69    Physical Exam Vitals and nursing note reviewed.  Constitutional:      General: She is not in acute distress.    Appearance: She is well-developed.  HENT:     Head: Normocephalic and atraumatic.     Right Ear: Tympanic membrane and ear canal normal.     Left Ear: Tympanic membrane and ear canal normal.     Nose:     Right Sinus: No maxillary sinus tenderness.     Left Sinus: No maxillary sinus tenderness.  Eyes:     General: No scleral icterus.       Right eye: No discharge.        Left eye: No discharge.     Conjunctiva/sclera: Conjunctivae normal.  Neck:     Thyroid: No thyromegaly.     Vascular: No carotid bruit.  Cardiovascular:     Rate and Rhythm: Normal rate and regular rhythm.     Pulses: Normal pulses.     Heart sounds: Normal heart sounds.  Pulmonary:     Effort: Pulmonary  effort is normal. No respiratory distress.     Breath sounds: No wheezing.  Chest:  Breasts:    Right: No mass, nipple discharge, skin change or tenderness.     Left: No mass, nipple discharge, skin change or tenderness.  Abdominal:     General: Bowel sounds are normal.     Palpations: Abdomen is soft.     Tenderness: There is no abdominal tenderness.  Musculoskeletal:     Right hand: Bony tenderness present.     Left hand: Bony tenderness present.     Cervical back: Normal range of motion. No erythema.     Right knee: No effusion.     Left knee: No effusion.     Right lower leg: No edema.     Left lower leg: No edema.  Lymphadenopathy:     Cervical: No cervical adenopathy.  Skin:    General: Skin is warm and dry.     Capillary Refill: Capillary refill takes less than 2 seconds.     Findings: No rash.  Neurological:     General: No focal deficit present.     Mental Status: She is alert and oriented to person, place, and time.     Cranial Nerves: No cranial nerve deficit.     Sensory: No sensory deficit.     Motor: Motor function is intact.     Gait: Gait normal.     Deep Tendon Reflexes: Reflexes are normal and symmetric.  Psychiatric:        Attention and Perception: Attention normal.        Mood and Affect: Mood normal.    Wt Readings from Last 3 Encounters:  07/27/21 190 lb (86.2 kg)  01/20/21 191 lb 9.6 oz (86.9 kg)  10/15/20 186 lb (84.4 kg)    BP 127/80   Pulse 78   Ht 5' 1.5" (1.562 m)   Wt 190 lb (86.2 kg)   SpO2 97%   BMI 35.32 kg/m   Assessment and Plan: 1. Annual physical exam Exam is normal except for weight. Encourage regular exercise  and appropriate dietary changes. Declines immunizations  2. Encounter for screening mammogram for breast cancer Schedule in October - MM 3D SCREEN BREAST BILATERAL  3. Essential hypertension Clinically stable exam with well controlled BP. Tolerating medications without side effects at this time. Pt to continue  current regimen and low sodium diet; benefits of regular exercise as able discussed. - Comprehensive metabolic panel - TSH - POCT urinalysis dipstick - hydrochlorothiazide (HYDRODIURIL) 25 MG tablet; Take 1 tablet (25 mg total) by mouth daily.  Dispense: 90 tablet; Refill: 1  4. Gastroesophageal reflux disease, unspecified whether esophagitis present Symptoms not well controlled on daily Pepcid due to Mobic therapy No red flag signs such as weight loss, n/v, melena Will continue pepcid and try a different nsaid. - CBC with Differential/Platelet  5. Hyperlipidemia, mixed Check labs and advise - Lipid panel  6. Hemangioma of intra-abdominal structures Overdue for repeat imaging  No symptoms referable to her liver Labs pending - MR LIVER W CONTRAST; Future  7. Primary osteoarthritis of both hands Hold Mobic and try Relafen for pain and possibly less GI side effects - nabumetone (RELAFEN) 500 MG tablet; Take 1 tablet (500 mg total) by mouth 2 (two) times daily.  Dispense: 20 tablet; Refill: 0   Partially dictated using Editor, commissioning. Any errors are unintentional.  Jessica Maidens, MD [facility]  07/27/2021

## 2021-07-28 LAB — COMPREHENSIVE METABOLIC PANEL
ALT: 20 IU/L (ref 0–32)
AST: 20 IU/L (ref 0–40)
Albumin/Globulin Ratio: 1.8 (ref 1.2–2.2)
Albumin: 4.3 g/dL (ref 3.8–4.8)
Alkaline Phosphatase: 48 IU/L (ref 44–121)
BUN/Creatinine Ratio: 29 — ABNORMAL HIGH (ref 12–28)
BUN: 18 mg/dL (ref 8–27)
Bilirubin Total: 0.4 mg/dL (ref 0.0–1.2)
CO2: 24 mmol/L (ref 20–29)
Calcium: 9.9 mg/dL (ref 8.7–10.3)
Chloride: 94 mmol/L — ABNORMAL LOW (ref 96–106)
Creatinine, Ser: 0.62 mg/dL (ref 0.57–1.00)
Globulin, Total: 2.4 g/dL (ref 1.5–4.5)
Glucose: 91 mg/dL (ref 70–99)
Potassium: 3.4 mmol/L — ABNORMAL LOW (ref 3.5–5.2)
Sodium: 140 mmol/L (ref 134–144)
Total Protein: 6.7 g/dL (ref 6.0–8.5)
eGFR: 97 mL/min/{1.73_m2} (ref >59)

## 2021-07-28 LAB — CBC WITH DIFFERENTIAL/PLATELET
Basophils Absolute: 0 10*3/uL (ref 0.0–0.2)
Basos: 1 %
EOS (ABSOLUTE): 0.1 10*3/uL (ref 0.0–0.4)
Eos: 1 %
Hematocrit: 40.9 % (ref 34.0–46.6)
Hemoglobin: 13.8 g/dL (ref 11.1–15.9)
Immature Grans (Abs): 0 10*3/uL (ref 0.0–0.1)
Immature Granulocytes: 0 %
Lymphocytes Absolute: 2.8 10*3/uL (ref 0.7–3.1)
Lymphs: 35 %
MCH: 30.3 pg (ref 26.6–33.0)
MCHC: 33.7 g/dL (ref 31.5–35.7)
MCV: 90 fL (ref 79–97)
Monocytes Absolute: 0.5 10*3/uL (ref 0.1–0.9)
Monocytes: 7 %
Neutrophils Absolute: 4.6 10*3/uL (ref 1.4–7.0)
Neutrophils: 56 %
Platelets: 255 10*3/uL (ref 150–450)
RBC: 4.55 x10E6/uL (ref 3.77–5.28)
RDW: 12.5 % (ref 11.7–15.4)
WBC: 8 10*3/uL (ref 3.4–10.8)

## 2021-07-28 LAB — LIPID PANEL
Chol/HDL Ratio: 3.4 ratio (ref 0.0–4.4)
Cholesterol, Total: 232 mg/dL — ABNORMAL HIGH (ref 100–199)
HDL: 68 mg/dL (ref >39)
LDL Chol Calc (NIH): 136 mg/dL — ABNORMAL HIGH (ref 0–99)
Triglycerides: 160 mg/dL — ABNORMAL HIGH (ref 0–149)
VLDL Cholesterol Cal: 28 mg/dL (ref 5–40)

## 2021-07-28 LAB — TSH: TSH: 1.83 u[IU]/mL (ref 0.450–4.500)

## 2021-08-03 ENCOUNTER — Other Ambulatory Visit: Payer: Self-pay | Admitting: Internal Medicine

## 2021-08-03 ENCOUNTER — Ambulatory Visit (INDEPENDENT_AMBULATORY_CARE_PROVIDER_SITE_OTHER): Payer: PPO

## 2021-08-03 DIAGNOSIS — Z0000 Encounter for general adult medical examination without abnormal findings: Secondary | ICD-10-CM | POA: Diagnosis not present

## 2021-08-03 DIAGNOSIS — M19041 Primary osteoarthritis, right hand: Secondary | ICD-10-CM

## 2021-08-03 DIAGNOSIS — I10 Essential (primary) hypertension: Secondary | ICD-10-CM

## 2021-08-03 MED ORDER — NABUMETONE 500 MG PO TABS: 500.0000 mg | ORAL_TABLET | Freq: Two times a day (BID) | ORAL | 2 refills | Status: DC

## 2021-08-03 NOTE — Patient Instructions (Signed)
Ms. Cimino , Thank you for taking time to come for your Medicare Wellness Visit. I appreciate your ongoing commitment to your health goals. Please review the following plan we discussed and let me know if I can assist you in the future.   Screening recommendations/referrals: Colonoscopy: done 10/15/20. Repeat 10/2030 Mammogram: done 12/06/20 Bone Density: done 11/05/18 Recommended yearly ophthalmology/optometry visit for glaucoma screening and checkup Recommended yearly dental visit for hygiene and checkup  Vaccinations: Influenza vaccine: n/a  Pneumococcal vaccine: declined Tdap vaccine: done 11/25/150 Shingles vaccine: Shingrix discussed. Please contact your pharmacy for coverage information.  Covid-19:done 06/06/19 & 07/18/19  Advanced directives: Please bring a copy of your health care power of attorney and living will to the office at your convenience.   Conditions/risks identified: recommend increasing physical activity   Next appointment: Follow up in one year for your annual wellness visit    Preventive Care 65 Years and Older, Female Preventive care refers to lifestyle choices and visits with your health care provider that can promote health and wellness. What does preventive care include? A yearly physical exam. This is also called an annual well check. Dental exams once or twice a year. Routine eye exams. Ask your health care provider how often you should have your eyes checked. Personal lifestyle choices, including: Daily care of your teeth and gums. Regular physical activity. Eating a healthy diet. Avoiding tobacco and drug use. Limiting alcohol use. Practicing safe sex. Taking low-dose aspirin every day. Taking vitamin and mineral supplements as recommended by your health care provider. What happens during an annual well check? The services and screenings done by your health care provider during your annual well check will depend on your age, overall health, lifestyle  risk factors, and family history of disease. Counseling  Your health care provider may ask you questions about your: Alcohol use. Tobacco use. Drug use. Emotional well-being. Home and relationship well-being. Sexual activity. Eating habits. History of falls. Memory and ability to understand (cognition). Work and work Statistician. Reproductive health. Screening  You may have the following tests or measurements: Height, weight, and BMI. Blood pressure. Lipid and cholesterol levels. These may be checked every 5 years, or more frequently if you are over 35 years old. Skin check. Lung cancer screening. You may have this screening every year starting at age 85 if you have a 30-pack-year history of smoking and currently smoke or have quit within the past 15 years. Fecal occult blood test (FOBT) of the stool. You may have this test every year starting at age 37. Flexible sigmoidoscopy or colonoscopy. You may have a sigmoidoscopy every 5 years or a colonoscopy every 10 years starting at age 62. Hepatitis C blood test. Hepatitis B blood test. Sexually transmitted disease (STD) testing. Diabetes screening. This is done by checking your blood sugar (glucose) after you have not eaten for a while (fasting). You may have this done every 1-3 years. Bone density scan. This is done to screen for osteoporosis. You may have this done starting at age 65. Mammogram. This may be done every 1-2 years. Talk to your health care provider about how often you should have regular mammograms. Talk with your health care provider about your test results, treatment options, and if necessary, the need for more tests. Vaccines  Your health care provider may recommend certain vaccines, such as: Influenza vaccine. This is recommended every year. Tetanus, diphtheria, and acellular pertussis (Tdap, Td) vaccine. You may need a Td booster every 10 years. Zoster vaccine. You  may need this after age 31. Pneumococcal  13-valent conjugate (PCV13) vaccine. One dose is recommended after age 14. Pneumococcal polysaccharide (PPSV23) vaccine. One dose is recommended after age 79. Talk to your health care provider about which screenings and vaccines you need and how often you need them. This information is not intended to replace advice given to you by your health care provider. Make sure you discuss any questions you have with your health care provider. Document Released: 03/19/2015 Document Revised: 11/10/2015 Document Reviewed: 12/22/2014 Elsevier Interactive Patient Education  2017 [employer] Prevention in the Home Falls can cause injuries. They can happen to people of all ages. There are many things you can do to make your home safe and to help prevent falls. What can I do on the outside of my home? Regularly fix the edges of walkways and driveways and fix any cracks. Remove anything that might make you trip as you walk through a door, such as a raised step or threshold. Trim any bushes or trees on the path to your home. Use bright outdoor lighting. Clear any walking paths of anything that might make someone trip, such as rocks or tools. Regularly check to see if handrails are loose or broken. Make sure that both sides of any steps have handrails. Any raised decks and porches should have guardrails on the edges. Have any leaves, snow, or ice cleared regularly. Use sand or salt on walking paths during winter. Clean up any spills in your garage right away. This includes oil or grease spills. What can I do in the bathroom? Use night lights. Install grab bars by the toilet and in the tub and shower. Do not use towel bars as grab bars. Use non-skid mats or decals in the tub or shower. If you need to sit down in the shower, use a plastic, non-slip stool. Keep the floor dry. Clean up any water that spills on the floor as soon as it happens. Remove soap buildup in the tub or shower regularly. Attach  bath mats securely with double-sided non-slip rug tape. Do not have throw rugs and other things on the floor that can make you trip. What can I do in the bedroom? Use night lights. Make sure that you have a light by your bed that is easy to reach. Do not use any sheets or blankets that are too big for your bed. They should not hang down onto the floor. Have a firm chair that has side arms. You can use this for support while you get dressed. Do not have throw rugs and other things on the floor that can make you trip. What can I do in the kitchen? Clean up any spills right away. Avoid walking on wet floors. Keep items that you use a lot in easy-to-reach places. If you need to reach something above you, use a strong step stool that has a grab bar. Keep electrical cords out of the way. Do not use floor polish or wax that makes floors slippery. If you must use wax, use non-skid floor wax. Do not have throw rugs and other things on the floor that can make you trip. What can I do with my stairs? Do not leave any items on the stairs. Make sure that there are handrails on both sides of the stairs and use them. Fix handrails that are broken or loose. Make sure that handrails are as long as the stairways. Check any carpeting to make sure that it is firmly  attached to the stairs. Fix any carpet that is loose or worn. Avoid having throw rugs at the top or bottom of the stairs. If you do have throw rugs, attach them to the floor with carpet tape. Make sure that you have a light switch at the top of the stairs and the bottom of the stairs. If you do not have them, ask someone to add them for you. What else can I do to help prevent falls? Wear shoes that: Do not have high heels. Have rubber bottoms. Are comfortable and fit you well. Are closed at the toe. Do not wear sandals. If you use a stepladder: Make sure that it is fully opened. Do not climb a closed stepladder. Make sure that both sides of the  stepladder are locked into place. Ask someone to hold it for you, if possible. Clearly mark and make sure that you can see: Any grab bars or handrails. First and last steps. Where the edge of each step is. Use tools that help you move around (mobility aids) if they are needed. These include: Canes. Walkers. Scooters. Crutches. Turn on the lights when you go into a dark area. Replace any light bulbs as soon as they burn out. Set up your furniture so you have a clear path. Avoid moving your furniture around. If any of your floors are uneven, fix them. If there are any pets around you, be aware of where they are. Review your medicines with your doctor. Some medicines can make you feel dizzy. This can increase your chance of falling. Ask your doctor what other things that you can do to help prevent falls. This information is not intended to replace advice given to you by your health care provider. Make sure you discuss any questions you have with your health care provider. Document Released: 12/17/2008 Document Revised: 07/29/2015 Document Reviewed: 03/27/2014 Elsevier Interactive Patient Education  2017 [employer].

## 2021-08-03 NOTE — Progress Notes (Signed)
Subjective:   Jessica Mcgee is a 71 y.o. female who presents for Medicare Annual (Subsequent) preventive examination.  Virtual Visit via Telephone Note  I connected with  [name] on 08/03/21 at  2:15 PM EDT by telephone and verified that I am speaking with the correct person using two identifiers.  Location: Patient: home Provider: [facility] Persons participating in the virtual visit: [place]   I discussed the limitations, risks, security and privacy concerns of performing an evaluation and management service by telephone and the availability of in person appointments. The patient expressed understanding and agreed to proceed.  Interactive audio and video telecommunications were attempted between this nurse and patient, however failed, due to patient having technical difficulties OR patient did not have access to video capability.  We continued and completed visit with audio only.  Some vital signs may be absent or patient reported.   Clemetine Marker, LPN   Review of Systems     Cardiac Risk Factors include: advanced age (>62mn, >>47women);hypertension;obesity (BMI >30kg/m2)     Objective:    Today's Vitals   08/03/21 1420  PainSc: 7    There is no height or weight on file to calculate BMI.     08/03/2021    2:35 PM 10/15/2020    8:52 AM 07/26/2020    2:48 PM 07/03/2019    7:40 PM 06/16/2019    3:35 PM  Advanced Directives  Does Patient Have a Medical Advance Directive? Yes Yes Yes Yes Yes  Type of AParamed[place]of [place]University [place]Living will HPle[place] PlainsLiving will HMount PleasantLiving will HFurnace CreekLiving will HEdge HillLiving will  Does patient want to make changes to medical advance directive?  No - Patient declined     Copy of [place] Chart? No - copy requested Yes - validated most recent copy scanned in chart (See row information) No - copy  requested  No - copy requested    Current Medications (verified) Outpatient Encounter Medications as of 08/03/2021  Medication Sig   Cholecalciferol (VITAMIN D-1000 MAX ST) 25 MCG (1000 UT) tablet Take 1,000 Units by mouth daily.   famotidine (PEPCID) 10 MG tablet Take 10 mg by mouth daily as needed for heartburn or indigestion.   hydrochlorothiazide (HYDRODIURIL) 25 MG tablet Take 1 tablet (25 mg total) by mouth daily.   HYDROcodone-acetaminophen (NORCO/VICODIN) 5-325 MG tablet Take 1 tablet by mouth every 4 (four) hours as needed.   Milk Thistle 175 MG CAPS Take 1 capsule by mouth in the morning and at bedtime.   Multiple Vitamins-Minerals (CENTRUM ADULTS) TABS Take by mouth.   nabumetone (RELAFEN) 500 MG tablet Take 1 tablet (500 mg total) by mouth 2 (two) times daily.   potassium chloride (KLOR-CON) 10 MEQ tablet TAKE 1 TABLET BY MOUTH EVERY DAY   S-Adenosylmethionine (SAM-E PO) Take by mouth.   [DISCONTINUED] meloxicam (MOBIC) 15 MG tablet TAKE 1 TABLET (15 MG TOTAL) BY MOUTH DAILY. (Patient taking differently: Take 7.5 mg by mouth daily.)   No facility-administered encounter medications on file as of 08/03/2021.    Allergies (verified) Cymbalta [duloxetine hcl], Flulaval quadrivalent [influenza vac split quad], Pineapple, and Latex   History: Past Medical History:  Diagnosis Date   Bell's palsy    DDD (degenerative disc disease), lumbar    Essential hypertension    Fatigue    Fibromyalgia    GERD (gastroesophageal reflux disease)    Hemangioma  of intra-abdominal structures    History of Lyme disease    Hyperlipidemia, mixed    PONV (postoperative nausea and vomiting)    Wears contact lenses    Right eye only   Past Surgical History:  Procedure Laterality Date   COLONOSCOPY     COLONOSCOPY WITH PROPOFOL N/A 10/15/2020   Procedure: COLONOSCOPY WITH PROPOFOL;  Surgeon: [name], MD;  Location: [place];  Service: Endoscopy;  Laterality: N/A;  Latex   TUBAL  LIGATION     Family History  Problem Relation Age of Onset   Hypertension Mother    Osteoporosis Mother    Breast cancer Mother        ~13   Alcohol abuse Mother    Cancer Mother    Hypertension Father    Heart attack Father    Stroke Father    Heart disease Father    Kidney disease Maternal Grandmother    Osteoporosis Maternal Grandmother    Alcohol abuse Maternal Grandmother    Arthritis Maternal Grandmother    Kidney disease Maternal Grandfather    Cancer Maternal Grandfather    Kidney disease Paternal Grandmother    Cancer Paternal Grandmother    Kidney disease Paternal Grandfather    Breast cancer Paternal Aunt        3 pat aunts   Cancer Paternal Aunt    Cancer Paternal Aunt    Social History   Socioeconomic History   Marital status: Married    Spouse name: Not on file   Number of children: 0   Years of education: Not on file   Highest education level: Not on file  Occupational History   Not on file  Tobacco Use   Smoking status: Never   Smokeless tobacco: Never  Vaping Use   Vaping Use: Never used  Substance and Sexual Activity   Alcohol use: Yes    Alcohol/week: 4.0 standard drinks    Types: 4 Glasses of wine per week    Comment: occassional   Drug use: No   Sexual activity: Yes    Birth control/protection: Post-menopausal  Other Topics Concern   Not on file  Social History Narrative   Not on file   Social Determinants of Health   Financial Resource Strain: Low Risk    Difficulty of Paying Living Expenses: Not hard at all  Food Insecurity: No Food Insecurity   Worried About Charity fundraiser in the Last Year: Never true   [place] in the Last Year: Never true  Transportation Needs: No Transportation Needs   Lack of Transportation (Medical): No   Lack of Transportation (Non-Medical): No  Physical Activity: Inactive   Days of Exercise per Week: 0 days   Minutes of Exercise per Session: 0 min  Stress: Stress Concern Present   Feeling  of Stress : Rather much  Social Connections: Moderately Isolated   Frequency of Communication with Friends and Family: More than three times a week   Frequency of Social Gatherings with Friends and Family: Twice a week   Attends Religious Services: Never   Marine scientist or Organizations: No   Attends Music therapist: Never   Marital Status: Married    Tobacco Counseling Counseling given: Not Answered   Clinical Intake:  Pre-visit preparation completed: Yes  Pain : 0-10 Pain Score: 7  Pain Type: Chronic pain Pain Location: Generalized Pain Descriptors / Indicators: Aching, Sore Pain Onset: More than a month ago Pain  Frequency: Constant     Nutritional Risks: None Diabetes: No  How often do you need to have someone help you when you read instructions, pamphlets, or other written materials from your doctor or pharmacy?: 1 - Never    Interpreter Needed?: No  Information entered by :: Clemetine Marker LPN   Activities of Daily Living    08/03/2021    2:36 PM 01/20/2021   11:07 AM  In your present state of health, do you have any difficulty performing the following activities:  Hearing? 0 0  Vision? 0 0  Difficulty concentrating or making decisions? 0 0  Walking or climbing stairs? 0 1  Dressing or bathing? 0 0  Doing errands, shopping? 0 0  Preparing Food and eating ? N   Using the Toilet? N   In the past six months, have you accidently leaked urine? N   Do you have problems with loss of bowel control? N   Managing your Medications? N   Managing your Finances? N   Housekeeping or managing your Housekeeping? N     Patient Care Team: Glean Hess, MD as PCP - General (Internal Medicine) Fredirick Maudlin (Pain Medicine) [name], MD as Consulting Physician (Gastroenterology) Candie Chroman, MD as Referring Physician (Physical Medicine and Rehabilitation)  Indicate any recent Medical Services you may have  received from other than [facility] providers in the past year (date may be approximate).     Assessment:   This is a routine wellness examination for Jessica Mcgee.  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; declines hearing aids Vision Screening - Comments:: Vision screenings done at [place] issues and exercise activities discussed: Current Exercise Habits: The patient does not participate in regular exercise at present, Exercise limited by: orthopedic condition(s);neurologic condition(s)   Goals Addressed   None   Depression Screen    08/03/2021    2:31 PM 07/27/2021   10:55 AM 01/20/2021   11:11 AM 01/20/2021   11:06 AM 07/26/2020    2:43 PM 05/12/2020   10:06 AM 06/16/2019    3:30 PM  PHQ 2/9 Scores  PHQ - 2 Score '4 4 2 '$ 0 1 0 2  PHQ- 9 Score '9 13 7 '$ 0 2 0 9    Fall Risk    07/27/2021   10:55 AM 01/20/2021   11:11 AM 01/20/2021   11:07 AM 07/26/2020    2:49 PM 05/12/2020   10:06 AM  [place] in the past year? 0 1 0 0 0  Number falls in past yr: 0 1 0 0 0  Injury with Fall? 0 0 0 0 0  Risk for fall due to : No Fall Risks History of fall(s) No Fall Risks No Fall Risks   Follow up Falls evaluation completed Falls evaluation completed Falls evaluation completed Falls prevention discussed Falls evaluation completed    [place]:  Any stairs in or around the home? Yes  If so, are there any without handrails? No  Home free of loose throw rugs in walkways, pet beds, electrical cords, etc? Yes  Adequate lighting in your home to reduce risk of falls? Yes   ASSISTIVE DEVICES UTILIZED TO PREVENT FALLS:  Life alert? No  Use of a cane, walker or w/c? Yes  - cane when needed Grab bars in the bathroom? No  Shower chair  or bench in shower? Yes  Elevated toilet seat or a handicapped toilet? No   TIMED UP AND GO:  Was the test performed? No .  Telephonic visit.   Cognitive Function: Normal cognitive status assessed by direct observation by this Nurse Health Advisor. No abnormalities found.          Immunizations Immunization History  Administered Date(s) Administered   PFIZER(Purple Top)SARS-COV-2 Vaccination 06/06/2019, 07/18/2019   Td 01/28/2014    TDAP status: Up to date  Flu Vaccine status: Declined, Education has been provided regarding the importance of this vaccine but patient still declined. Advised may receive this vaccine at local pharmacy or Health Dept. Aware to provide a copy of the vaccination record if obtained from local pharmacy or Health Dept. Verbalized acceptance and understanding.  Pneumococcal vaccine status: Declined,  Education has been provided regarding the importance of this vaccine but patient still declined. Advised may receive this vaccine at local pharmacy or Health Dept. Aware to provide a copy of the vaccination record if obtained from local pharmacy or Health Dept. Verbalized acceptance and understanding.   Covid-19 vaccine status: Completed vaccines  Qualifies for Shingles Vaccine? Yes   Zostavax completed No   Shingrix Completed?: No.    Education has been provided regarding the importance of this vaccine. Patient has been advised to call insurance company to determine out of pocket expense if they have not yet received this vaccine. Advised may also receive vaccine at local pharmacy or Health Dept. Verbalized acceptance and understanding.  Screening Tests Health Maintenance  Topic Date Due   MAMMOGRAM  12/06/2021   TETANUS/TDAP  01/29/2024   COLONOSCOPY (Pts 45-66yr Insurance coverage will need to be confirmed)  10/16/2030   DEXA SCAN  Completed   Hepatitis C Screening  Completed   HPV VACCINES  Aged Out   Pneumonia Vaccine 71 Years old  Discontinued   COVID-19 Vaccine  Discontinued   Zoster Vaccines- Shingrix  Discontinued    Health Maintenance  There are no preventive care  reminders to display for this patient.  Colorectal cancer screening: Type of screening: Colonoscopy. Completed 10/15/20. Repeat every 10 years  Mammogram status: Completed 12/06/20. Repeat every year  Bone Density status: Completed 11/05/18. Results reflect: Bone density results: NORMAL. Repeat every 3-5 years.  Lung Cancer Screening: (Low Dose CT Chest recommended if Age 69-80years, 30 pack-year currently smoking OR have quit w/in 15years.) does not qualify.   Additional Screening:  Hepatitis C Screening: does qualify; Completed 10/07/15  Vision Screening: Recommended annual ophthalmology exams for early detection of glaucoma and other disorders of the eye. Is the patient up to date with their annual eye exam?  Yes  Who is the provider or what is the name of the office in which the patient attends annual eye exams? [facility]   Dental Screening: Recommended annual dental exams for proper oral hygiene  Community Resource Referral / Chronic Care Management: CRR required this visit?  No   CCM required this visit?  No      Plan:     I have personally reviewed and noted the following in the patient's chart:   Medical and social history Use of alcohol, tobacco or illicit drugs  Current medications and supplements including opioid prescriptions.  Functional ability and status Nutritional status Physical activity Advanced directives List of other physicians Hospitalizations, surgeries, and ER visits in previous 12 months Vitals Screenings to include cognitive, depression, and falls Referrals and appointments  In addition, I have  reviewed and discussed with patient certain preventive protocols, quality metrics, and best practice recommendations. A written personalized care plan for preventive services as well as general preventive health recommendations were provided to patient.     Clemetine Marker, LPN   09/22/9196   Nurse Notes: pt switched from meloxicam to nabumetone  which she states has decreased her heart burn and she will be requesting refill because it seems to be helping.

## 2021-08-04 ENCOUNTER — Telehealth: Payer: Self-pay

## 2021-08-04 ENCOUNTER — Other Ambulatory Visit: Payer: Self-pay | Admitting: Internal Medicine

## 2021-08-04 DIAGNOSIS — F418 Other specified anxiety disorders: Secondary | ICD-10-CM

## 2021-08-04 MED ORDER — DIAZEPAM 5 MG PO TABS: ORAL_TABLET | ORAL | 0 refills | Status: DC

## 2021-08-04 NOTE — Telephone Encounter (Signed)
-----   Message from Glean Hess, MD sent at 07/27/2021  4:47 PM EDT ----- Regarding: RE: oral sedation I can send her in a Valium but she will have to have someone drive her to the appointment and back home again and she can not drive for the rest of that day.  ----- Message ----- From: Lionel December Sent: 07/27/2021   3:26 PM EDT To: Glean Hess, MD Subject: oral sedation                                  Patient would like an  oral sedation for her MRI scheduled on 09/09/21.  Thanks

## 2021-08-04 NOTE — Telephone Encounter (Signed)
Patient informed. She would like valium sent to her pharmacy at [place] [facility] in [facility].

## 2021-08-04 NOTE — Telephone Encounter (Signed)
Requested Prescriptions  Pending Prescriptions Disp Refills  . potassium chloride (KLOR-CON) 10 MEQ tablet [Pharmacy Med Name: POTASSIUM CL ER 10 MEQ TABLET] 30 tablet 1    Sig: TAKE 1 TABLET BY [place] DAY     Endocrinology:  Minerals - Potassium Supplementation Failed - 08/03/2021  8:30 AM      Failed - K in normal range and within 360 days    Potassium  Date Value Ref Range Status  07/27/2021 3.4 (L) 3.5 - 5.2 mmol/L Final         Passed - Cr in normal range and within 360 days    Creatinine, Ser  Date Value Ref Range Status  07/27/2021 0.62 0.57 - 1.00 mg/dL Final         Passed - Valid encounter within last 12 months    Recent Outpatient Visits          1 week ago Annual physical exam   [facility] Glean Hess, MD   6 months ago Trigger middle finger of left hand   [facility] Glean Hess, MD   1 year ago Annual physical exam   [facility] Glean Hess, MD   1 year ago Essential hypertension   [facility] Glean Hess, MD   2 years ago Annual physical exam   [facility] Glean Hess, MD      Future Appointments            In 5 months Army Melia Jesse Sans, MD [facility]

## 2021-09-09 ENCOUNTER — Ambulatory Visit
Admission: RE | Admit: 2021-09-09 | Discharge: 2021-09-09 | Disposition: A | Discharge status: Home / Self Care | Payer: PPO | Source: Ambulatory Visit | Attending: Internal Medicine | Admitting: Internal Medicine

## 2021-09-09 DIAGNOSIS — D1803 Hemangioma of intra-abdominal structures: Secondary | ICD-10-CM | POA: Insufficient documentation

## 2021-09-09 DIAGNOSIS — K7689 Other specified diseases of liver: Secondary | ICD-10-CM | POA: Diagnosis not present

## 2021-09-09 DIAGNOSIS — K8020 Calculus of gallbladder without cholecystitis without obstruction: Secondary | ICD-10-CM | POA: Diagnosis not present

## 2021-09-09 MED ORDER — GADOBUTROL 1 MMOL/ML IV SOLN
8.0000 mL | Freq: Once | INTRAVENOUS | Status: AC | PRN
Start: 2021-09-09 — End: 2021-09-09
  Administered 2021-09-09: 8 mL via INTRAVENOUS

## 2021-10-23 ENCOUNTER — Other Ambulatory Visit: Payer: Self-pay | Admitting: Internal Medicine

## 2021-10-23 DIAGNOSIS — I10 Essential (primary) hypertension: Secondary | ICD-10-CM

## 2021-10-25 NOTE — Telephone Encounter (Signed)
Rx 07/27/21 #90 1RF- 6 month supply Requested Prescriptions  Pending Prescriptions Disp Refills  . hydrochlorothiazide (HYDRODIURIL) 25 MG tablet [Pharmacy Med Name: HYDROCHLOROTHIAZIDE 25 MG TAB] 90 tablet 1    Sig: TAKE 1 TABLET (25 MG TOTAL) BY MOUTH DAILY.     Cardiovascular: Diuretics - Thiazide Failed - 10/23/2021  7:01 PM      Failed - K in normal range and within 180 days    Potassium  Date Value Ref Range Status  07/27/2021 3.4 (L) 3.5 - 5.2 mmol/L Final         Passed - Cr in normal range and within 180 days    Creatinine, Ser  Date Value Ref Range Status  07/27/2021 0.62 0.57 - 1.00 mg/dL Final         Passed - Na in normal range and within 180 days    Sodium  Date Value Ref Range Status  07/27/2021 140 134 - 144 mmol/L Final         Passed - Last BP in normal range    BP Readings from Last 1 Encounters:  07/27/21 127/80         Passed - Valid encounter within last 6 months    Recent Outpatient Visits          3 months ago Annual physical exam   [place] [facility], Jesse Sans, MD   9 months ago Trigger middle finger of left hand   [place] [facility] at [facility], Jesse Sans, MD   1 year ago Annual physical exam   [place] [facility] at [facility], Jesse Sans, MD   2 years ago Essential hypertension   [place] [facility] at [facility] Jesse Sans, MD   2 years ago Annual physical exam   [facility] at [facility], Jesse Sans, MD      Future Appointments            In 3 months Army Melia, Jesse Sans, MD [place] [facility] at [facility]

## 2021-10-28 ENCOUNTER — Ambulatory Visit (INDEPENDENT_AMBULATORY_CARE_PROVIDER_SITE_OTHER): Payer: PPO | Admitting: Internal Medicine

## 2021-10-28 ENCOUNTER — Encounter: Payer: Self-pay | Admitting: Internal Medicine

## 2021-10-28 VITALS — BP 140/72 | HR 75 | Temp 98.2°F | Ht 61.5 in | Wt 191.0 lb

## 2021-10-28 DIAGNOSIS — M25562 Pain in left knee: Secondary | ICD-10-CM | POA: Diagnosis not present

## 2021-10-28 DIAGNOSIS — R0981 Nasal congestion: Secondary | ICD-10-CM | POA: Diagnosis not present

## 2021-10-28 DIAGNOSIS — I10 Essential (primary) hypertension: Secondary | ICD-10-CM

## 2021-10-28 DIAGNOSIS — G894 Chronic pain syndrome: Secondary | ICD-10-CM | POA: Diagnosis not present

## 2021-10-28 DIAGNOSIS — Z79899 Other long term (current) drug therapy: Secondary | ICD-10-CM | POA: Diagnosis not present

## 2021-10-28 DIAGNOSIS — M170 Bilateral primary osteoarthritis of knee: Secondary | ICD-10-CM | POA: Diagnosis not present

## 2021-10-28 DIAGNOSIS — M25561 Pain in right knee: Secondary | ICD-10-CM | POA: Diagnosis not present

## 2021-10-28 MED ORDER — HYDROCHLOROTHIAZIDE 25 MG PO TABS: 25.0000 mg | ORAL_TABLET | Freq: Every day | ORAL | 1 refills | Status: DC

## 2021-10-28 MED ORDER — AZITHROMYCIN 250 MG PO TABS: ORAL_TABLET | ORAL | 0 refills | Status: AC

## 2021-10-28 MED ORDER — POTASSIUM CHLORIDE ER 10 MEQ PO TBCR: 10.0000 meq | EXTENDED_RELEASE_TABLET | Freq: Every day | ORAL | 1 refills | Status: DC

## 2021-10-28 NOTE — Progress Notes (Signed)
Date:  10/28/2021   Name:  Jessica Mcgee [facility]   DOB:  04/28/1950   MRN:  [id number]   Chief Complaint: Cough (With congestion.)  Cough This is a new problem. The current episode started more than 1 month ago. The problem has been waxing and waning. The cough is Productive of sputum. Associated symptoms include ear congestion, nasal congestion, postnasal drip and rhinorrhea. Pertinent negatives include no ear pain, fever, sore throat or shortness of breath. The symptoms are aggravated by lying down.    Lab Results  Component Value Date   NA 140 07/27/2021   K 3.4 (L) 07/27/2021   CO2 24 07/27/2021   GLUCOSE 91 07/27/2021   BUN 18 07/27/2021   CREATININE 0.62 07/27/2021   CALCIUM 9.9 07/27/2021   EGFR 97 07/27/2021   GFRNONAA 77 05/07/2019   Lab Results  Component Value Date   CHOL 232 (H) 07/27/2021   HDL 68 07/27/2021   LDLCALC 136 (H) 07/27/2021   TRIG 160 (H) 07/27/2021   CHOLHDL 3.4 07/27/2021   Lab Results  Component Value Date   TSH 1.830 07/27/2021   No results found for: "HGBA1C" Lab Results  Component Value Date   WBC 8.0 07/27/2021   HGB 13.8 07/27/2021   HCT 40.9 07/27/2021   MCV 90 07/27/2021   PLT 255 07/27/2021   Lab Results  Component Value Date   ALT 20 07/27/2021   AST 20 07/27/2021   ALKPHOS 48 07/27/2021   BILITOT 0.4 07/27/2021   Lab Results  Component Value Date   VD25OH 41.9 08/25/2014     Review of Systems  Constitutional:  Negative for fever.  HENT:  Positive for postnasal drip and rhinorrhea. Negative for ear pain and sore throat.   Respiratory:  Positive for cough. Negative for shortness of breath.     Patient Active Problem List   Diagnosis Date Noted   Trigger middle finger of left hand 01/20/2021   Primary osteoarthritis of both hands 01/20/2021   Encounter for screening colonoscopy    Hyperlipidemia, mixed 05/08/2019   History of Lyme disease 03/31/2019   Osteoarthritis of both knees 07/04/2017   Hemangioma of  intra-abdominal structures 04/19/2016   GERD (gastroesophageal reflux disease) 10/07/2015   Essential hypertension 02/10/2015   Fibromyalgia 08/25/2014    Allergies  Allergen Reactions   Cymbalta [Duloxetine Hcl]     Marked fatigue   Flulaval Quadrivalent [Influenza Vac Split Quad]     Unable to take due to hx of lime disease and bell's palsy   Pineapple Swelling    Tongue swelling, mouth ulcers   Latex Rash    (With extended contact)    Past Surgical History:  Procedure Laterality Date   COLONOSCOPY     COLONOSCOPY WITH PROPOFOL N/A 10/15/2020   Procedure: COLONOSCOPY WITH PROPOFOL;  Surgeon: [name], MD;  Location: [place];  Service: Endoscopy;  Laterality: N/A;  Latex   TUBAL LIGATION      Social History   Tobacco Use   Smoking status: Never   Smokeless tobacco: Never  Vaping Use   Vaping Use: Never used  Substance Use Topics   Alcohol use: Yes    Alcohol/week: 4.0 standard drinks of alcohol    Types: 4 Glasses of wine per week    Comment: occassional   Drug use: No     Medication list has been reviewed and updated.  Current Meds  Medication Sig   azithromycin (ZITHROMAX Z-PAK) 250 MG tablet UAD  Cholecalciferol (VITAMIN D-1000 MAX ST) 25 MCG (1000 UT) tablet Take 1,000 Units by mouth daily.   famotidine (PEPCID) 10 MG tablet Take 10 mg by mouth daily as needed for heartburn or indigestion.   HYDROcodone-acetaminophen (NORCO/VICODIN) 5-325 MG tablet Take 1 tablet by mouth every 4 (four) hours as needed.   Milk Thistle 175 MG CAPS Take 1 capsule by mouth in the morning and at bedtime.   Multiple Vitamins-Minerals (CENTRUM ADULTS) TABS Take by mouth.   S-Adenosylmethionine (SAM-E PO) Take by mouth.   [DISCONTINUED] hydrochlorothiazide (HYDRODIURIL) 25 MG tablet Take 1 tablet (25 mg total) by mouth daily.   [DISCONTINUED] potassium chloride (KLOR-CON) 10 MEQ tablet TAKE 1 TABLET BY MOUTH EVERY DAY       10/28/2021    9:03 AM 07/27/2021    10:55 AM 01/20/2021   11:11 AM 01/20/2021   11:07 AM  GAD 7 : Generalized Anxiety Score  Nervous, Anxious, on Edge 0 1 1 0  Control/stop worrying 0 1 1 0  Worry too much - different things 0 1 1 0  Trouble relaxing 0 1 1 0  Restless 0 1 1 0  Easily annoyed or irritable 0 2 1 0  Afraid - awful might happen 0 1 1 0  Total GAD 7 Score 0 8 7 0  Anxiety Difficulty Not difficult at all Somewhat difficult Not difficult at all Not difficult at all       10/28/2021    9:03 AM 08/03/2021    2:31 PM 07/27/2021   10:55 AM  Depression screen PHQ 2/9  Decreased Interest _0 Down, Depressed, Hopeless _1 PHQ - 2 Score _2 Altered sleeping 2 0 2  Tired, decreased energy _3 Change in appetite _4 Feeling bad or failure about yourself  0 0 1  Trouble concentrating 0 1 1  Moving slowly or fidgety/restless 0 1 0  Suicidal thoughts 0 0 1  PHQ-9 Score _5 Difficult doing work/chores Not difficult at all Somewhat difficult Somewhat difficult    BP Readings from Last 3 Encounters:  10/28/21 (!) 140/72  07/27/21 127/80  01/20/21 134/68    Physical Exam Constitutional:      Appearance: Normal appearance. She is well-developed.  HENT:     Right Ear: Ear canal and external ear normal. Tympanic membrane is not erythematous or retracted.     Left Ear: Ear canal and external ear normal. Tympanic membrane is not erythematous or retracted.     Nose:     Right Sinus: Maxillary sinus tenderness and frontal sinus tenderness present.     Left Sinus: Maxillary sinus tenderness and frontal sinus tenderness present.     Mouth/Throat:     Mouth: No oral lesions.     Pharynx: Uvula midline. No oropharyngeal exudate or posterior oropharyngeal erythema.  Cardiovascular:     Rate and Rhythm: Normal rate and regular rhythm.     Heart sounds: Normal heart sounds.  Pulmonary:     Effort: Pulmonary effort is normal.     Breath sounds: Normal breath sounds. No wheezing, rhonchi or rales.   Lymphadenopathy:     Cervical: No cervical adenopathy.  Neurological:     Mental Status: She is alert and oriented to person, place, and time.     Wt Readings from Last 3 Encounters:  10/28/21 191 lb (86.6 kg)  07/27/21 190 lb (86.2 kg)  01/20/21 191 lb 9.6  oz (86.9 kg)    BP (!) 140/72   Pulse 75   Temp 98.2 F (36.8 C) (Oral)   Ht 5' 1.5" (1.562 m)   Wt 191 lb (86.6 kg)   SpO2 97%   BMI 35.50 kg/m   Assessment and Plan: 1. Sinus congestion Recommend Flonase and Coricidin HBP for symptoms relief Treat for indolent sinusitis. Follow up if no improvement. - azithromycin (ZITHROMAX Z-PAK) 250 MG tablet; UAD  Dispense: 6 each; Refill: 0  2. Essential hypertension Clinically stable exam with well controlled BP. Tolerating medications without side effects at this time. - hydrochlorothiazide (HYDRODIURIL) 25 MG tablet; Take 1 tablet (25 mg total) by mouth daily.  Dispense: 90 tablet; Refill: 1 - potassium chloride (KLOR-CON) 10 MEQ tablet; Take 1 tablet (10 mEq total) by mouth daily.  Dispense: 90 tablet; Refill: 1   Partially dictated using Editor, commissioning. Any errors are unintentional.  Halina Maidens, MD [facility]  10/28/2021

## 2021-10-28 NOTE — Patient Instructions (Signed)
Flonase and Coricidin HBP daily

## 2021-12-26 ENCOUNTER — Ambulatory Visit
Admission: RE | Admit: 2021-12-26 | Discharge: 2021-12-26 | Disposition: A | Discharge status: Home / Self Care | Payer: PPO | Source: Ambulatory Visit | Attending: Internal Medicine | Admitting: Internal Medicine

## 2021-12-26 DIAGNOSIS — Z1231 Encounter for screening mammogram for malignant neoplasm of breast: Secondary | ICD-10-CM | POA: Insufficient documentation

## 2022-01-13 DIAGNOSIS — M25561 Pain in right knee: Secondary | ICD-10-CM | POA: Diagnosis not present

## 2022-01-13 DIAGNOSIS — Z5181 Encounter for therapeutic drug level monitoring: Secondary | ICD-10-CM | POA: Diagnosis not present

## 2022-01-13 DIAGNOSIS — M170 Bilateral primary osteoarthritis of knee: Secondary | ICD-10-CM | POA: Diagnosis not present

## 2022-01-13 DIAGNOSIS — Z79899 Other long term (current) drug therapy: Secondary | ICD-10-CM | POA: Diagnosis not present

## 2022-01-13 DIAGNOSIS — M25562 Pain in left knee: Secondary | ICD-10-CM | POA: Diagnosis not present

## 2022-01-13 DIAGNOSIS — G894 Chronic pain syndrome: Secondary | ICD-10-CM | POA: Diagnosis not present

## 2022-01-30 ENCOUNTER — Encounter: Payer: Self-pay | Admitting: Internal Medicine

## 2022-01-30 ENCOUNTER — Ambulatory Visit (INDEPENDENT_AMBULATORY_CARE_PROVIDER_SITE_OTHER): Payer: PPO | Admitting: Internal Medicine

## 2022-01-30 VITALS — BP 129/74 | HR 74 | Ht 61.5 in | Wt 190.2 lb

## 2022-01-30 DIAGNOSIS — I10 Essential (primary) hypertension: Secondary | ICD-10-CM | POA: Diagnosis not present

## 2022-01-30 DIAGNOSIS — F411 Generalized anxiety disorder: Secondary | ICD-10-CM | POA: Insufficient documentation

## 2022-01-30 MED ORDER — ESCITALOPRAM OXALATE 10 MG PO TABS: 10.0000 mg | ORAL_TABLET | Freq: Every day | ORAL | 1 refills | Status: DC

## 2022-01-30 NOTE — Patient Instructions (Signed)
Take 1/2 tablet Lexapro for 6-8 days then increase to 10 mg.

## 2022-01-30 NOTE — Progress Notes (Signed)
Date:  01/30/2022   Name:  Jessica Mcgee [facility]   DOB:  1951-01-03   MRN:  [id number]   Chief Complaint: Hypertension, Anxiety, and Depression  Hypertension This is a chronic problem. The problem is controlled. Associated symptoms include anxiety. Pertinent negatives include no chest pain, headaches, palpitations or shortness of breath. Past treatments include diuretics. The current treatment provides significant improvement.  Anxiety Presents for initial visit. Onset was 6 to 12 months ago. The problem has been gradually worsening. Symptoms include excessive worry and nervous/anxious behavior. Patient reports no chest pain, dizziness, palpitations, shortness of breath or suicidal ideas. Symptoms occur constantly. The severity of symptoms is causing significant distress. The symptoms are aggravated by family issues and work stress.   Past treatments include nothing.    Lab Results  Component Value Date   NA 140 07/27/2021   K 3.4 (L) 07/27/2021   CO2 24 07/27/2021   GLUCOSE 91 07/27/2021   BUN 18 07/27/2021   CREATININE 0.62 07/27/2021   CALCIUM 9.9 07/27/2021   EGFR 97 07/27/2021   GFRNONAA 77 05/07/2019   Lab Results  Component Value Date   CHOL 232 (H) 07/27/2021   HDL 68 07/27/2021   LDLCALC 136 (H) 07/27/2021   TRIG 160 (H) 07/27/2021   CHOLHDL 3.4 07/27/2021   Lab Results  Component Value Date   TSH 1.830 07/27/2021   No results found for: "HGBA1C" Lab Results  Component Value Date   WBC 8.0 07/27/2021   HGB 13.8 07/27/2021   HCT 40.9 07/27/2021   MCV 90 07/27/2021   PLT 255 07/27/2021   Lab Results  Component Value Date   ALT 20 07/27/2021   AST 20 07/27/2021   ALKPHOS 48 07/27/2021   BILITOT 0.4 07/27/2021   Lab Results  Component Value Date   VD25OH 41.9 08/25/2014     Review of Systems  Constitutional:  Negative for fatigue and unexpected weight change.  HENT:  Negative for nosebleeds and trouble swallowing.   Eyes:  Negative for visual  disturbance.  Respiratory:  Negative for cough, chest tightness, shortness of breath and wheezing.   Cardiovascular:  Negative for chest pain, palpitations and leg swelling.  Gastrointestinal:  Negative for abdominal pain, constipation and diarrhea.  Musculoskeletal:  Positive for arthralgias and gait problem.  Neurological:  Negative for dizziness, weakness, light-headedness and headaches.  Psychiatric/Behavioral:  Positive for dysphoric mood and sleep disturbance. Negative for suicidal ideas. The patient is nervous/anxious.     Patient Active Problem List   Diagnosis Date Noted   Trigger middle finger of left hand 01/20/2021   Primary osteoarthritis of both hands 01/20/2021   Encounter for screening colonoscopy    Hyperlipidemia, mixed 05/08/2019   History of Lyme disease 03/31/2019   Osteoarthritis of both knees 07/04/2017   Hemangioma of intra-abdominal structures 04/19/2016   GERD (gastroesophageal reflux disease) 10/07/2015   Essential hypertension 02/10/2015   Fibromyalgia 08/25/2014    Allergies  Allergen Reactions   Cymbalta [Duloxetine Hcl]     Marked fatigue   Flulaval Quadrivalent [Influenza Vac Split Quad]     Unable to take due to hx of lime disease and bell's palsy   Pineapple Swelling    Tongue swelling, mouth ulcers   Latex Rash    (With extended contact)    Past Surgical History:  Procedure Laterality Date   COLONOSCOPY     COLONOSCOPY WITH PROPOFOL N/A 10/15/2020   Procedure: COLONOSCOPY WITH PROPOFOL;  Surgeon: [name], MD;  Location:  [place];  Service: Endoscopy;  Laterality: N/A;  Latex   TUBAL LIGATION      Social History   Tobacco Use   Smoking status: Never   Smokeless tobacco: Never  Vaping Use   Vaping Use: Never used  Substance Use Topics   Alcohol use: Yes    Alcohol/week: 4.0 standard drinks of alcohol    Types: 4 Glasses of wine per week    Comment: occassional   Drug use: No     Medication list has been  reviewed and updated.  Current Meds  Medication Sig   Cholecalciferol (VITAMIN D-1000 MAX ST) 25 MCG (1000 UT) tablet Take 1,000 Units by mouth daily.   famotidine (PEPCID) 10 MG tablet Take 10 mg by mouth daily as needed for heartburn or indigestion.   hydrochlorothiazide (HYDRODIURIL) 25 MG tablet Take 1 tablet (25 mg total) by mouth daily.   Milk Thistle 175 MG CAPS Take 1 capsule by mouth in the morning and at bedtime.   Multiple Vitamins-Minerals (CENTRUM ADULTS) TABS Take by mouth.   potassium chloride (KLOR-CON) 10 MEQ tablet Take 1 tablet (10 mEq total) by mouth daily.   S-Adenosylmethionine (SAM-E PO) Take by mouth.   [DISCONTINUED] HYDROcodone-acetaminophen (NORCO/VICODIN) 5-325 MG tablet Take 1 tablet by mouth every 4 (four) hours as needed.       01/30/2022   10:52 AM 10/28/2021    9:03 AM 07/27/2021   10:55 AM 01/20/2021   11:11 AM  GAD 7 : Generalized Anxiety Score  Nervous, Anxious, on Edge 2 0 1 1  Control/stop worrying 2 0 1 1  Worry too much - different things 2 0 1 1  Trouble relaxing 2 0 1 1  Restless 2 0 1 1  Easily annoyed or irritable 1 0 2 1  Afraid - awful might happen 3 0 1 1  Total GAD 7 Score 14 0 8 7  Anxiety Difficulty Very difficult Not difficult at all Somewhat difficult Not difficult at all       01/30/2022   10:51 AM 10/28/2021    9:03 AM 08/03/2021    2:31 PM  Depression screen PHQ 2/9  Decreased Interest _0 Down, Depressed, Hopeless _1 PHQ - 2 Score _2 Altered sleeping 1 2 0  Tired, decreased energy _3 Change in appetite _4 Feeling bad or failure about yourself  1 0 0  Trouble concentrating 1 0 1  Moving slowly or fidgety/restless 1 0 1  Suicidal thoughts 0 0 0  PHQ-9 Score _5 Difficult doing work/chores Somewhat difficult Not difficult at all Somewhat difficult    BP Readings from Last 3 Encounters:  01/30/22 129/74  10/28/21 (!) 140/72  07/27/21 127/80    Physical Exam Vitals and nursing note  reviewed.  Constitutional:      General: She is not in acute distress.    Appearance: She is well-developed.  HENT:     Head: Normocephalic and atraumatic.  Cardiovascular:     Rate and Rhythm: Normal rate and regular rhythm.     Heart sounds: No murmur heard. Pulmonary:     Effort: Pulmonary effort is normal. No respiratory distress.     Breath sounds: No wheezing or rhonchi.  Musculoskeletal:     Cervical back: Normal range of motion.     Right lower leg: No edema.     Left lower leg: No edema.  Lymphadenopathy:  Cervical: No cervical adenopathy.  Skin:    General: Skin is warm and dry.     Findings: No rash.  Neurological:     Mental Status: She is alert and oriented to person, place, and time.  Psychiatric:        Mood and Affect: Affect is tearful.        Speech: Speech normal.        Behavior: Behavior normal.        Thought Content: Thought content does not include suicidal ideation. Thought content does not include suicidal plan.     Wt Readings from Last 3 Encounters:  01/30/22 190 lb 3.2 oz (86.3 kg)  10/28/21 191 lb (86.6 kg)  07/27/21 190 lb (86.2 kg)    BP 129/74   Pulse 74   Ht 5' 1.5" (1.562 m)   Wt 190 lb 3.2 oz (86.3 kg)   SpO2 97%   BMI 35.36 kg/m   Assessment and Plan: 1. Essential hypertension Clinically stable exam with well controlled BP. Tolerating medications without side effects at this time. Pt to continue current regimen and low sodium diet; benefits of regular exercise as able discussed.  2. Generalized anxiety disorder Becoming more intrusive on day to day activities at work and home. Has taken Elavil in the past with significant weight gain Will start Lexapro 5 mg then increase to 10 mg. Follow up in one month. - escitalopram (LEXAPRO) 10 MG tablet; Take 1 tablet (10 mg total) by mouth daily.  Dispense: 30 tablet; Refill: 1   Partially dictated using Editor, commissioning. Any errors are unintentional.  Halina Maidens, MD [facility]  01/30/2022

## 2022-02-22 ENCOUNTER — Other Ambulatory Visit: Payer: Self-pay | Admitting: Internal Medicine

## 2022-02-22 DIAGNOSIS — F411 Generalized anxiety disorder: Secondary | ICD-10-CM

## 2022-02-22 NOTE — Telephone Encounter (Signed)
Requested medication (s) are due for refill today: Dx code needed  Requested medication (s) are on the active medication list: yes  Last refill:  01/30/22  Future visit scheduled: yes  Notes to clinic:  : [place]. DX Code Needed.      Requested Prescriptions  Pending Prescriptions Disp Refills   escitalopram (LEXAPRO) 10 MG tablet [Pharmacy Med Name: ESCITALOPRAM 10 MG TABLET] 90 tablet 1    Sig: TAKE 1 TABLET BY MOUTH EVERY DAY     Psychiatry:  Antidepressants - SSRI Passed - 02/22/2022  1:34 PM      Passed - Valid encounter within last 6 months    Recent Outpatient Visits           3 weeks ago Essential hypertension   [place] [facility] at [facility], Jesse Sans, MD   3 months ago Sinus congestion   [place] [facility] at [facility], Jesse Sans, MD   7 months ago Annual physical exam   [place] [facility] at [facility], Jesse Sans, MD   1 year ago Trigger middle finger of left hand   [place] [facility] at [facility], Jesse Sans, MD   1 year ago Annual physical exam   [facility] at [facility], Jesse Sans, MD       Future Appointments             In 1 week Army Melia, Jesse Sans, MD [place] [facility] at [facility]

## 2022-03-01 ENCOUNTER — Encounter: Payer: Self-pay | Admitting: Internal Medicine

## 2022-03-01 ENCOUNTER — Ambulatory Visit (INDEPENDENT_AMBULATORY_CARE_PROVIDER_SITE_OTHER): Payer: PPO | Admitting: Internal Medicine

## 2022-03-01 VITALS — BP 128/72 | HR 76 | Ht 61.5 in | Wt 189.6 lb

## 2022-03-01 DIAGNOSIS — F411 Generalized anxiety disorder: Secondary | ICD-10-CM | POA: Diagnosis not present

## 2022-03-01 MED ORDER — ESCITALOPRAM OXALATE 10 MG PO TABS: 10.0000 mg | ORAL_TABLET | Freq: Every day | ORAL | 1 refills | Status: DC

## 2022-03-01 NOTE — Assessment & Plan Note (Addendum)
Worsening anxiety last month Started on Lexapro - now on 10 mg Symptoms improved without side effects Will continue current therapy for now

## 2022-03-01 NOTE — Progress Notes (Signed)
Date:  03/01/2022   Name:  Jessica Mcgee [facility]   DOB:  [birth year]   MRN:  [id number]   Chief Complaint: Anxiety  Anxiety Presents for follow-up visit. Symptoms include excessive worry, irritability, nervous/anxious behavior, obsessions and restlessness.   Compliance with medications is 76-100% (lexapro started last month).    Lab Results  Component Value Date   NA 140 07/27/2021   K 3.4 (L) 07/27/2021   CO2 24 07/27/2021   GLUCOSE 91 07/27/2021   BUN 18 07/27/2021   CREATININE 0.62 07/27/2021   CALCIUM 9.9 07/27/2021   EGFR 97 07/27/2021   GFRNONAA 77 05/07/2019   Lab Results  Component Value Date   CHOL 232 (H) 07/27/2021   HDL 68 07/27/2021   LDLCALC 136 (H) 07/27/2021   TRIG 160 (H) 07/27/2021   CHOLHDL 3.4 07/27/2021   Lab Results  Component Value Date   TSH 1.830 07/27/2021   No results found for: "HGBA1C" Lab Results  Component Value Date   WBC 8.0 07/27/2021   HGB 13.8 07/27/2021   HCT 40.9 07/27/2021   MCV 90 07/27/2021   PLT 255 07/27/2021   Lab Results  Component Value Date   ALT 20 07/27/2021   AST 20 07/27/2021   ALKPHOS 48 07/27/2021   BILITOT 0.4 07/27/2021   Lab Results  Component Value Date   VD25OH 41.9 08/25/2014     Review of Systems  Constitutional:  Positive for irritability.  Psychiatric/Behavioral:  The patient is nervous/anxious.     Patient Active Problem List   Diagnosis Date Noted   Generalized anxiety disorder 01/30/2022   Trigger middle finger of left hand 01/20/2021   Primary osteoarthritis of both hands 01/20/2021   Encounter for screening colonoscopy    Hyperlipidemia, mixed 05/08/2019   History of Lyme disease 03/31/2019   Osteoarthritis of both knees 07/04/2017   Hemangioma of intra-abdominal structures 04/19/2016   GERD (gastroesophageal reflux disease) 10/07/2015   Essential hypertension 02/10/2015   Fibromyalgia 08/25/2014    Allergies  Allergen Reactions   Cymbalta [Duloxetine Hcl]     Marked fatigue    Flulaval Quadrivalent [Influenza Vac Split Quad]     Unable to take due to hx of lime disease and bell's palsy   Pineapple Swelling    Tongue swelling, mouth ulcers   Latex Rash    (With extended contact)    Past Surgical History:  Procedure Laterality Date   COLONOSCOPY     COLONOSCOPY WITH PROPOFOL N/A 10/15/2020   Procedure: COLONOSCOPY WITH PROPOFOL;  Surgeon: [name], MD;  Location: [place];  Service: Endoscopy;  Laterality: N/A;  Latex   TUBAL LIGATION      Social History   Tobacco Use   Smoking status: Never   Smokeless tobacco: Never  Vaping Use   Vaping Use: Never used  Substance Use Topics   Alcohol use: Yes    Alcohol/week: 4.0 standard drinks of alcohol    Types: 4 Glasses of wine per week    Comment: occassional   Drug use: No     Medication list has been reviewed and updated.  Current Meds  Medication Sig   Cholecalciferol (VITAMIN D-1000 MAX ST) 25 MCG (1000 UT) tablet Take 1,000 Units by mouth daily.   escitalopram (LEXAPRO) 10 MG tablet Take 1 tablet (10 mg total) by mouth daily.   famotidine (PEPCID) 10 MG tablet Take 10 mg by mouth daily as needed for heartburn or indigestion.   hydrochlorothiazide (HYDRODIURIL) 25 MG  tablet Take 1 tablet (25 mg total) by mouth daily.   Milk Thistle 175 MG CAPS Take 1 capsule by mouth in the morning and at bedtime.   Multiple Vitamins-Minerals (CENTRUM ADULTS) TABS Take by mouth.   potassium chloride (KLOR-CON) 10 MEQ tablet Take 1 tablet (10 mEq total) by mouth daily.   S-Adenosylmethionine (SAM-E PO) Take by mouth.       03/01/2022    1:28 PM 01/30/2022   10:52 AM 10/28/2021    9:03 AM 07/27/2021   10:55 AM  GAD 7 : Generalized Anxiety Score  Nervous, Anxious, on Edge 1 2 0 1  Control/stop worrying 1 2 0 1  Worry too much - different things 1 2 0 1  Trouble relaxing 1 2 0 1  Restless 1 2 0 1  Easily annoyed or irritable 1 1 0 2  Afraid - awful might happen 2 3 0 1  Total GAD 7 Score 8  14 0 8  Anxiety Difficulty Somewhat difficult Very difficult Not difficult at all Somewhat difficult       03/01/2022    1:28 PM 01/30/2022   10:51 AM 10/28/2021    9:03 AM  Depression screen PHQ 2/9  Decreased Interest _0 Down, Depressed, Hopeless _1 PHQ - 2 Score _2 Altered sleeping 0 1 2  Tired, decreased energy 0 1 2  Change in appetite 0 1 2  Feeling bad or failure about yourself  0 1 0  Trouble concentrating 1 1 0  Moving slowly or fidgety/restless 0 1 0  Suicidal thoughts 0 0 0  PHQ-9 Score _3 Difficult doing work/chores Not difficult at all Somewhat difficult Not difficult at all    BP Readings from Last 3 Encounters:  03/01/22 128/72  01/30/22 129/74  10/28/21 (!) 140/72    Physical Exam Vitals and nursing note reviewed.  Constitutional:      General: She is not in acute distress.    Appearance: Normal appearance. She is well-developed.  HENT:     Head: Normocephalic and atraumatic.  Cardiovascular:     Rate and Rhythm: Normal rate and regular rhythm.  Pulmonary:     Effort: Pulmonary effort is normal. No respiratory distress.     Breath sounds: No wheezing or rhonchi.  Musculoskeletal:     Right lower leg: No edema.     Left lower leg: No edema.  Skin:    General: Skin is warm and dry.     Capillary Refill: Capillary refill takes less than 2 seconds.     Findings: No rash.  Neurological:     Mental Status: She is alert and oriented to person, place, and time.  Psychiatric:        Mood and Affect: Mood normal.        Behavior: Behavior normal.     Wt Readings from Last 3 Encounters:  03/01/22 189 lb 9.6 oz (86 kg)  01/30/22 190 lb 3.2 oz (86.3 kg)  10/28/21 191 lb (86.6 kg)    BP 128/72   Pulse 76   Ht 5' 1.5" (1.562 m)   Wt 189 lb 9.6 oz (86 kg)   SpO2 98%   BMI 35.24 kg/m   Assessment and Plan: Problem List Items Addressed This Visit       Other   Generalized anxiety disorder - Primary (Chronic)    Worsening  anxiety last month Started on Lexapro - now on 10  mg Symptoms improved without side effects Will continue current therapy for now      Relevant Medications   escitalopram (LEXAPRO) 10 MG tablet     Partially dictated using Dragon software. Any errors are unintentional.  Laura Berglund, MD [facility]  03/01/2022       

## 2022-03-27 ENCOUNTER — Other Ambulatory Visit: Payer: Self-pay | Admitting: Internal Medicine

## 2022-03-27 DIAGNOSIS — F411 Generalized anxiety disorder: Secondary | ICD-10-CM

## 2022-04-14 DIAGNOSIS — Z79899 Other long term (current) drug therapy: Secondary | ICD-10-CM | POA: Diagnosis not present

## 2022-04-14 DIAGNOSIS — M25562 Pain in left knee: Secondary | ICD-10-CM | POA: Diagnosis not present

## 2022-04-14 DIAGNOSIS — Z5181 Encounter for therapeutic drug level monitoring: Secondary | ICD-10-CM | POA: Diagnosis not present

## 2022-04-14 DIAGNOSIS — M170 Bilateral primary osteoarthritis of knee: Secondary | ICD-10-CM | POA: Diagnosis not present

## 2022-04-14 DIAGNOSIS — G894 Chronic pain syndrome: Secondary | ICD-10-CM | POA: Diagnosis not present

## 2022-04-14 DIAGNOSIS — M25561 Pain in right knee: Secondary | ICD-10-CM | POA: Diagnosis not present

## 2022-06-29 ENCOUNTER — Other Ambulatory Visit: Payer: Self-pay | Admitting: Internal Medicine

## 2022-06-29 DIAGNOSIS — I10 Essential (primary) hypertension: Secondary | ICD-10-CM

## 2022-07-11 ENCOUNTER — Other Ambulatory Visit: Payer: Self-pay | Admitting: Internal Medicine

## 2022-07-11 DIAGNOSIS — I10 Essential (primary) hypertension: Secondary | ICD-10-CM

## 2022-07-12 NOTE — Telephone Encounter (Signed)
Requested Prescriptions  Pending Prescriptions Disp Refills   potassium chloride (KLOR-CON) 10 MEQ tablet [Pharmacy Med Name: POTASSIUM CL ER 10 MEQ TABLET] 90 tablet 0    Sig: TAKE 1 TABLET BY MOUTH EVERY DAY     Endocrinology:  Minerals - Potassium Supplementation Failed - 07/11/2022  7:52 PM      Failed - K in normal range and within 360 days    Potassium  Date Value Ref Range Status  07/27/2021 3.4 (L) 3.5 - 5.2 mmol/L Final         Passed - Cr in normal range and within 360 days    Creatinine, Ser  Date Value Ref Range Status  07/27/2021 0.62 0.57 - 1.00 mg/dL Final         Passed - Valid encounter within last 12 months    Recent Outpatient Visits           4 months ago Generalized anxiety disorder   [place] [facility] at [facility], Nyoka Cowden, MD   5 months ago Essential hypertension   [place] [facility] at [facility], Nyoka Cowden, MD   8 months ago Sinus congestion   [facility] at [facility], Nyoka Cowden, MD   11 months ago Annual physical exam   [facility] at [facility], Nyoka Cowden, MD   1 year ago Trigger middle finger of left hand   [facility] at [facility], Nyoka Cowden, MD       Future Appointments             In 3 weeks Judithann Graves Nyoka Cowden, MD [facility] at [facility], [facility]

## 2022-07-20 ENCOUNTER — Telehealth: Payer: Self-pay | Admitting: Internal Medicine

## 2022-07-20 NOTE — Telephone Encounter (Signed)
Copied from CRM [id number]. Topic: Medicare AWV >> Jul 20, 2022 10:02 AM Payton Doughty wrote: Reason for CRM: Called patient to reschedule Medicare Annual Wellness Visit (AWV). Left message for patient to call back to confirm new AWV appt on August 09, 2022 at 2:30pm. [email or web address]  Please schedule an appointment at any time with Kennedy Bucker, LPN  .  If any questions, please contact me.  Thank you ,  Verlee Rossetti; Care Guide Ambulatory Clinical Support [place] [facility] Direct Dial: [phone]

## 2022-07-20 NOTE — Telephone Encounter (Signed)
Contacted Chela Turrell Lacerda to schedule their annual wellness visit. Appointment made for resched AWV for 08/09/2022.Marland Kitchen  Verlee Rossetti; Care Guide Ambulatory Clinical Support  l [facility] Direct Dial: [phone]

## 2022-07-24 DIAGNOSIS — Z79899 Other long term (current) drug therapy: Secondary | ICD-10-CM | POA: Diagnosis not present

## 2022-07-24 DIAGNOSIS — G894 Chronic pain syndrome: Secondary | ICD-10-CM | POA: Diagnosis not present

## 2022-07-24 DIAGNOSIS — M170 Bilateral primary osteoarthritis of knee: Secondary | ICD-10-CM | POA: Diagnosis not present

## 2022-08-02 ENCOUNTER — Ambulatory Visit (INDEPENDENT_AMBULATORY_CARE_PROVIDER_SITE_OTHER): Payer: PPO | Admitting: Internal Medicine

## 2022-08-02 ENCOUNTER — Encounter: Payer: Self-pay | Admitting: Internal Medicine

## 2022-08-02 VITALS — BP 129/76 | HR 8 | Ht 61.5 in | Wt 194.6 lb

## 2022-08-02 DIAGNOSIS — M797 Fibromyalgia: Secondary | ICD-10-CM | POA: Diagnosis not present

## 2022-08-02 DIAGNOSIS — F411 Generalized anxiety disorder: Secondary | ICD-10-CM

## 2022-08-02 DIAGNOSIS — E782 Mixed hyperlipidemia: Secondary | ICD-10-CM

## 2022-08-02 DIAGNOSIS — K219 Gastro-esophageal reflux disease without esophagitis: Secondary | ICD-10-CM | POA: Diagnosis not present

## 2022-08-02 DIAGNOSIS — I10 Essential (primary) hypertension: Secondary | ICD-10-CM

## 2022-08-02 DIAGNOSIS — Z0000 Encounter for general adult medical examination without abnormal findings: Secondary | ICD-10-CM | POA: Diagnosis not present

## 2022-08-02 DIAGNOSIS — Z1231 Encounter for screening mammogram for malignant neoplasm of breast: Secondary | ICD-10-CM

## 2022-08-02 LAB — POCT URINALYSIS DIPSTICK
Bilirubin, UA: NEGATIVE
Blood, UA: NEGATIVE
Glucose, UA: NEGATIVE
Ketones, UA: NEGATIVE
Leukocytes, UA: NEGATIVE
Nitrite, UA: NEGATIVE
Protein, UA: NEGATIVE
Spec Grav, UA: 1.015 (ref 1.010–1.025)
Urobilinogen, UA: 0.2 E.U./dL
pH, UA: 7 (ref 5.0–8.0)

## 2022-08-02 MED ORDER — ESCITALOPRAM OXALATE 10 MG PO TABS: 10.0000 mg | ORAL_TABLET | Freq: Every day | ORAL | 1 refills | Status: DC

## 2022-08-02 NOTE — Assessment & Plan Note (Addendum)
Clinically stable on current regimen with good control of symptoms, No SI or HI. Some recent losses - a beloved [place], a parrot and a dog but coping well which she attributes to Lexapro. No change in management at this time. Lexapro 10 mg

## 2022-08-02 NOTE — Assessment & Plan Note (Signed)
Lipids managed with diet alone. Lab Results  Component Value Date   LDLCALC 136 (H) 07/27/2021

## 2022-08-02 NOTE — Assessment & Plan Note (Addendum)
Reflux symptoms are minimal on current therapy - pepcid was changed to Prilosec for now. No red flag signs such as weight loss, n/v, melena

## 2022-08-02 NOTE — Progress Notes (Signed)
Date:  08/02/2022   Name:  Jessica Mcgee [facility]   DOB:  27-Oct-1950   MRN:  [id number]   Chief Complaint: Annual Exam Jessica Mcgee is a 72 y.o. female who presents today for her Complete Annual Exam. She feels well. She reports exercising - exercise bike. She reports she is sleeping well. Breast complaints - none.  Mammogram: 12/2021 DEXA: 11/2018 Pap smear: 05/2020 neg thin prep Colonoscopy: 10/2020 repeat 10 yrs  Health Maintenance Due  Topic Date Due   Medicare Annual Wellness (AWV)  08/04/2022    Immunization History  Administered Date(s) Administered   PFIZER(Purple Top)SARS-COV-2 Vaccination 06/06/2019, 07/18/2019   Td 01/28/2014    Hypertension This is a chronic problem. The problem is controlled. Pertinent negatives include no chest pain, headaches, palpitations or shortness of breath.  Gastroesophageal Reflux She complains of heartburn. She reports no abdominal pain, no chest pain, no coughing or no wheezing. This is a recurrent problem. The problem occurs occasionally. Pertinent negatives include no fatigue. She has tried a histamine-2 antagonist for the symptoms.    Lab Results  Component Value Date   NA 140 07/27/2021   K 3.4 (L) 07/27/2021   CO2 24 07/27/2021   GLUCOSE 91 07/27/2021   BUN 18 07/27/2021   CREATININE 0.62 07/27/2021   CALCIUM 9.9 07/27/2021   EGFR 97 07/27/2021   GFRNONAA 77 05/07/2019   Lab Results  Component Value Date   CHOL 232 (H) 07/27/2021   HDL 68 07/27/2021   LDLCALC 136 (H) 07/27/2021   TRIG 160 (H) 07/27/2021   CHOLHDL 3.4 07/27/2021   Lab Results  Component Value Date   TSH 1.830 07/27/2021   No results found for: "HGBA1C" Lab Results  Component Value Date   WBC 8.0 07/27/2021   HGB 13.8 07/27/2021   HCT 40.9 07/27/2021   MCV 90 07/27/2021   PLT 255 07/27/2021   Lab Results  Component Value Date   ALT 20 07/27/2021   AST 20 07/27/2021   ALKPHOS 48 07/27/2021   BILITOT 0.4 07/27/2021   Lab Results  Component Value  Date   VD25OH 41.9 08/25/2014     Review of Systems  Constitutional:  Negative for chills, fatigue and fever.  HENT:  Negative for congestion, hearing loss, tinnitus, trouble swallowing and voice change.   Eyes:  Negative for visual disturbance.  Respiratory:  Negative for cough, chest tightness, shortness of breath and wheezing.   Cardiovascular:  Negative for chest pain, palpitations and leg swelling.  Gastrointestinal:  Positive for heartburn. Negative for abdominal pain, constipation, diarrhea and vomiting.  Endocrine: Negative for polydipsia and polyuria.  Genitourinary:  Negative for dysuria, frequency, genital sores, vaginal bleeding and vaginal discharge.  Musculoskeletal:  Positive for arthralgias, gait problem and myalgias. Negative for joint swelling.  Skin:  Negative for color change and rash.  Neurological:  Negative for dizziness, tremors, light-headedness and headaches.  Hematological:  Negative for adenopathy. Does not bruise/bleed easily.  Psychiatric/Behavioral:  Positive for dysphoric mood. Negative for sleep disturbance and suicidal ideas. The patient is nervous/anxious.     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   GERD (gastroesophageal reflux disease) 10/07/2015   Essential hypertension 02/10/2015  Fibromyalgia 08/25/2014    Allergies  Allergen Reactions   Cymbalta [Duloxetine Hcl]     Marked fatigue   Flulaval Quadrivalent [Influenza Vac Split Quad]     Unable to take due to hx of lime disease and bell's palsy   Pineapple Swelling    Tongue swelling, mouth ulcers   Latex Rash    (With extended contact)    Past Surgical History:   Procedure Laterality Date   COLONOSCOPY     COLONOSCOPY WITH PROPOFOL N/A 10/15/2020   Procedure: COLONOSCOPY WITH PROPOFOL;  Surgeon: Midge Minium, MD;  Location: [facility];  Service: Endoscopy;  Laterality: N/A;  Latex   TUBAL LIGATION      Social History   Tobacco Use   Smoking status: Never   Smokeless tobacco: Never  Vaping Use   Vaping Use: Never used  Substance Use Topics   Alcohol use: Yes    Alcohol/week: 4.0 standard drinks of alcohol    Types: 4 Glasses of wine per week    Comment: occassional   Drug use: No     Medication list has been reviewed and updated.  Current Meds  Medication Sig   Cholecalciferol (VITAMIN D-1000 MAX ST) 25 MCG (1000 UT) tablet Take 1,000 Units by mouth daily.   hydrochlorothiazide (HYDRODIURIL) 25 MG tablet TAKE 1 TABLET (25 MG TOTAL) BY MOUTH DAILY.   HYDROcodone-acetaminophen (NORCO/VICODIN) 5-325 MG tablet Take 0.5-1 tablets by mouth daily as needed.   Milk Thistle 175 MG CAPS Take 1 capsule by mouth in the morning and at bedtime.   Multiple Vitamins-Minerals (CENTRUM ADULTS) TABS Take by mouth.   omeprazole (PRILOSEC OTC) 20 MG tablet Take 20 mg by mouth daily.   potassium chloride (KLOR-CON) 10 MEQ tablet TAKE 1 TABLET BY MOUTH EVERY DAY   S-Adenosylmethionine (SAM-E PO) Take by mouth.   [DISCONTINUED] escitalopram (LEXAPRO) 10 MG tablet TAKE 1 TABLET BY MOUTH EVERY DAY   [DISCONTINUED] famotidine (PEPCID) 10 MG tablet Take 10 mg by mouth daily as needed for heartburn or indigestion.       08/02/2022    9:54 AM 03/01/2022    1:28 PM 01/30/2022   10:52 AM 10/28/2021    9:03 AM  GAD 7 : Generalized Anxiety Score  Nervous, Anxious, on Edge 0 1 2 0  Control/stop worrying 0 1 2 0  Worry too much - different things 0 1 2 0  Trouble relaxing 0 1 2 0  Restless 0 1 2 0  Easily annoyed or irritable 0 1 1 0  Afraid - awful might happen 0 2 3 0  Total GAD 7 Score 0 8 14 0  Anxiety Difficulty Not difficult at all Somewhat  difficult Very difficult Not difficult at all       08/02/2022    9:54 AM 03/01/2022    1:28 PM 01/30/2022   10:51 AM  Depression screen PHQ 2/9  Decreased Interest 2 1 1   Down, Depressed, Hopeless 2 1 1   PHQ - 2 Score 4 2 2   Altered sleeping 2 0 1  Tired, decreased energy 3 0 1  Change in appetite 2 0 1  Feeling bad or failure about yourself  1 0 1  Trouble concentrating 2 1 1   Moving slowly or fidgety/restless 1 0 1  Suicidal thoughts 0 0 0  PHQ-9 Score 15 3 8   Difficult doing work/chores Very difficult Not difficult at all Somewhat difficult    BP Readings from Last 3 Encounters:  08/02/22 129/76  03/01/22 128/72  01/30/22 129/74    Physical Exam Vitals and nursing note reviewed.  Constitutional:      General: She is not in acute distress.    Appearance: She is well-developed.  HENT:     Head: Normocephalic and atraumatic.     Right Ear: Tympanic membrane and ear canal normal.     Left Ear: Tympanic membrane and ear canal normal.     Nose:     Right Sinus: No maxillary sinus tenderness.     Left Sinus: No maxillary sinus tenderness.  Eyes:     General: No scleral icterus.       Right eye: No discharge.        Left eye: No discharge.     Conjunctiva/sclera: Conjunctivae normal.  Neck:     Thyroid: No thyromegaly.     Vascular: No carotid bruit.  Cardiovascular:     Rate and Rhythm: Normal rate and regular rhythm.     Pulses: Normal pulses.     Heart sounds: Normal heart sounds.  Pulmonary:     Effort: Pulmonary effort is normal. No respiratory distress.     Breath sounds: No wheezing.  Chest:  Breasts:    Right: No mass, nipple discharge, skin change or tenderness.     Left: No mass, nipple discharge, skin change or tenderness.  Abdominal:     General: Bowel sounds are normal.     Palpations: Abdomen is soft.     Tenderness: There is no abdominal tenderness.  Musculoskeletal:     Cervical back: Normal range of motion. No erythema.     Right lower  leg: No edema.     Left lower leg: No edema.  Lymphadenopathy:     Cervical: No cervical adenopathy.  Skin:    General: Skin is warm and dry.     Capillary Refill: Capillary refill takes less than 2 seconds.     Findings: No rash.  Neurological:     General: No focal deficit present.     Mental Status: She is alert and oriented to person, place, and time.     Cranial Nerves: No cranial nerve deficit.     Sensory: No sensory deficit.     Deep Tendon Reflexes: Reflexes are normal and symmetric.  Psychiatric:        Attention and Perception: Attention normal.        Mood and Affect: Mood normal.        Behavior: Behavior normal.     Wt Readings from Last 3 Encounters:  08/02/22 194 lb 9.6 oz (88.3 kg)  03/01/22 189 lb 9.6 oz (86 kg)  01/30/22 190 lb 3.2 oz (86.3 kg)    BP 129/76 (BP Location: Right Arm, Cuff Size: Large)   Pulse (!) 8   Ht 5' 1.5" (1.562 m)   Wt 194 lb 9.6 oz (88.3 kg)   SpO2 100%   BMI 36.17 kg/m   Assessment and Plan:  Problem List Items Addressed This Visit     Hyperlipidemia, mixed (Chronic)    Lipids managed with diet alone. Lab Results  Component Value Date   LDLCALC 136 (H) 07/27/2021        Relevant Orders   Lipid panel   GERD (gastroesophageal reflux disease) (Chronic)    Reflux symptoms are minimal on current therapy - pepcid was changed to Prilosec for now. No red flag signs such as weight loss, n/v, melena       Relevant Medications  omeprazole (PRILOSEC OTC) 20 MG tablet   Other Relevant Orders   CBC with Differential/Platelet   Generalized anxiety disorder (Chronic)    Clinically stable on current regimen with good control of symptoms, No SI or HI. Some recent losses - a beloved [place], a parrot and a dog but coping well which she attributes to Lexapro. No change in management at this time. Lexapro 10 mg      Relevant Medications   escitalopram (LEXAPRO) 10 MG tablet   Fibromyalgia (Chronic)    Has done well with  myofacial release. Currently doing Acu-puncture with [facility] on [address]      Relevant Medications   HYDROcodone-acetaminophen (NORCO/VICODIN) 5-325 MG tablet   escitalopram (LEXAPRO) 10 MG tablet   Essential hypertension (Chronic)    Stable exam with well controlled BP.  Currently taking HCTZ. Tolerating medications without concerns or side effects. Will continue to recommend low sodium diet and current regimen.       Relevant Orders   Comprehensive metabolic panel   TSH   POCT urinalysis dipstick   Other Visit Diagnoses     Annual physical exam    -  Primary   Encounter for screening mammogram for breast cancer       Relevant Orders   MM 3D SCREENING MAMMOGRAM BILATERAL BREAST       Return in about 6 months (around 02/02/2023) for HTN.   Partially dictated using Dragon software, any errors are not intentional.  Reubin Milan, MD [facility] [place], [place]

## 2022-08-02 NOTE — Assessment & Plan Note (Signed)
Stable exam with well controlled BP.  Currently taking HCTZ. Tolerating medications without concerns or side effects. Will continue to recommend low sodium diet and current regimen.

## 2022-08-02 NOTE — Patient Instructions (Signed)
Call [facility] to schedule your mammogram at [phone].  

## 2022-08-02 NOTE — Assessment & Plan Note (Addendum)
Has done well with myofacial release. Currently doing Acu-puncture with [facility] on [address]

## 2022-08-03 LAB — CBC WITH DIFFERENTIAL/PLATELET
Basophils Absolute: 0 10*3/uL (ref 0.0–0.2)
Basos: 1 %
EOS (ABSOLUTE): 0.1 10*3/uL (ref 0.0–0.4)
Eos: 1 %
Hematocrit: 37.7 % (ref 34.0–46.6)
Hemoglobin: 12.5 g/dL (ref 11.1–15.9)
Immature Grans (Abs): 0 10*3/uL (ref 0.0–0.1)
Immature Granulocytes: 0 %
Lymphocytes Absolute: 2.3 10*3/uL (ref 0.7–3.1)
Lymphs: 30 %
MCH: 28.8 pg (ref 26.6–33.0)
MCHC: 33.2 g/dL (ref 31.5–35.7)
MCV: 87 fL (ref 79–97)
Monocytes Absolute: 0.6 10*3/uL (ref 0.1–0.9)
Monocytes: 8 %
Neutrophils Absolute: 4.6 10*3/uL (ref 1.4–7.0)
Neutrophils: 60 %
Platelets: 411 10*3/uL (ref 150–450)
RBC: 4.34 x10E6/uL (ref 3.77–5.28)
RDW: 12.2 % (ref 11.7–15.4)
WBC: 7.6 10*3/uL (ref 3.4–10.8)

## 2022-08-03 LAB — TSH: TSH: 2.11 u[IU]/mL (ref 0.450–4.500)

## 2022-08-03 LAB — LIPID PANEL
Chol/HDL Ratio: 3.1 ratio (ref 0.0–4.4)
Cholesterol, Total: 166 mg/dL (ref 100–199)
HDL: 53 mg/dL (ref >39)
LDL Chol Calc (NIH): 98 mg/dL (ref 0–99)
Triglycerides: 82 mg/dL (ref 0–149)
VLDL Cholesterol Cal: 15 mg/dL (ref 5–40)

## 2022-08-03 LAB — COMPREHENSIVE METABOLIC PANEL
ALT: 16 IU/L (ref 0–32)
AST: 14 IU/L (ref 0–40)
Albumin/Globulin Ratio: 1.5 (ref 1.2–2.2)
Albumin: 3.8 g/dL — ABNORMAL LOW (ref 3.9–4.9)
Alkaline Phosphatase: 62 IU/L (ref 44–121)
BUN/Creatinine Ratio: 24 (ref 12–28)
BUN: 14 mg/dL (ref 8–27)
Bilirubin Total: 0.2 mg/dL (ref 0.0–1.2)
CO2: 25 mmol/L (ref 20–29)
Calcium: 9.6 mg/dL (ref 8.7–10.3)
Chloride: 100 mmol/L (ref 96–106)
Creatinine, Ser: 0.58 mg/dL (ref 0.57–1.00)
Globulin, Total: 2.5 g/dL (ref 1.5–4.5)
Glucose: 87 mg/dL (ref 70–99)
Potassium: 4 mmol/L (ref 3.5–5.2)
Sodium: 140 mmol/L (ref 134–144)
Total Protein: 6.3 g/dL (ref 6.0–8.5)
eGFR: 98 mL/min/{1.73_m2} (ref >59)

## 2022-08-09 ENCOUNTER — Ambulatory Visit (INDEPENDENT_AMBULATORY_CARE_PROVIDER_SITE_OTHER): Payer: PPO

## 2022-08-09 VITALS — Ht 61.5 in | Wt 194.0 lb

## 2022-08-09 DIAGNOSIS — Z0000 Encounter for general adult medical examination without abnormal findings: Secondary | ICD-10-CM | POA: Diagnosis not present

## 2022-08-09 NOTE — Patient Instructions (Addendum)
Jessica Mcgee , Thank you for taking time to come for your Medicare Wellness Visit. I appreciate your ongoing commitment to your health goals. Please review the following plan we discussed and let me know if I can assist you in the future.   These are the goals we discussed:  Goals      DIET - EAT MORE FRUITS AND VEGETABLES        This is a list of the screening recommended for you and due dates:  Health Maintenance  Topic Date Due   Mammogram  12/27/2022   Medicare Annual Wellness Visit  08/09/2023   DTaP/Tdap/Td vaccine (2 - Tdap) 01/29/2024   Colon Cancer Screening  10/16/2030   DEXA scan (bone density measurement)  Completed   Hepatitis C Screening  Completed   HPV Vaccine  Aged Out   Pneumonia Vaccine  Discontinued   COVID-19 Vaccine  Discontinued   Zoster (Shingles) Vaccine  Discontinued    Advanced directives: no  Conditions/risks identified: none  Next appointment: Follow up in one year for your annual wellness visit 08/15/23 @ 1:30 pm by phone  Preventive Care 65 Years and Older, Female Preventive care refers to lifestyle choices and visits with your health care provider that can promote health and wellness. What does preventive care include? A yearly physical exam. This is also called an annual well check. Dental exams once or twice a year. Routine eye exams. Ask your health care provider how often you should have your eyes checked. Personal lifestyle choices, including: Daily care of your teeth and gums. Regular physical activity. Eating a healthy diet. Avoiding tobacco and drug use. Limiting alcohol use. Practicing safe sex. Taking low-dose aspirin every day. Taking vitamin and mineral supplements as recommended by your health care provider. What happens during an annual well check? The services and screenings done by your health care provider during your annual well check will depend on your age, overall health, lifestyle risk factors, and family history of  disease. Counseling  Your health care provider may ask you questions about your: Alcohol use. Tobacco use. Drug use. Emotional well-being. Home and relationship well-being. Sexual activity. Eating habits. History of falls. Memory and ability to understand (cognition). Work and work Astronomer. Reproductive health. Screening  You may have the following tests or measurements: Height, weight, and BMI. Blood pressure. Lipid and cholesterol levels. These may be checked every 5 years, or more frequently if you are over 7 years old. Skin check. Lung cancer screening. You may have this screening every year starting at age 43 if you have a 30-pack-year history of smoking and currently smoke or have quit within the past 15 years. Fecal occult blood test (FOBT) of the stool. You may have this test every year starting at age 41. Flexible sigmoidoscopy or colonoscopy. You may have a sigmoidoscopy every 5 years or a colonoscopy every 10 years starting at age 41. Hepatitis C blood test. Hepatitis B blood test. Sexually transmitted disease (STD) testing. Diabetes screening. This is done by checking your blood sugar (glucose) after you have not eaten for a while (fasting). You may have this done every 1-3 years. Bone density scan. This is done to screen for osteoporosis. You may have this done starting at age 65. Mammogram. This may be done every 1-2 years. Talk to your health care provider about how often you should have regular mammograms. Talk with your health care provider about your test results, treatment options, and if necessary, the need for more tests.  Vaccines  Your health care provider may recommend certain vaccines, such as: Influenza vaccine. This is recommended every year. Tetanus, diphtheria, and acellular pertussis (Tdap, Td) vaccine. You may need a Td booster every 10 years. Zoster vaccine. You may need this after age 48. Pneumococcal 13-valent conjugate (PCV13) vaccine. One  dose is recommended after age 37. Pneumococcal polysaccharide (PPSV23) vaccine. One dose is recommended after age 19. Talk to your health care provider about which screenings and vaccines you need and how often you need them. This information is not intended to replace advice given to you by your health care provider. Make sure you discuss any questions you have with your health care provider. Document Released: 03/19/2015 Document Revised: 11/10/2015 Document Reviewed: 12/22/2014 Elsevier Interactive Patient Education  2017 [employer].  Fall Prevention in the Home Falls can cause injuries. They can happen to people of all ages. There are many things you can do to make your home safe and to help prevent falls. What can I do on the outside of my home? Regularly fix the edges of walkways and driveways and fix any cracks. Remove anything that might make you trip as you walk through a door, such as a raised step or threshold. Trim any bushes or trees on the path to your home. Use bright outdoor lighting. Clear any walking paths of anything that might make someone trip, such as rocks or tools. Regularly check to see if handrails are loose or broken. Make sure that both sides of any steps have handrails. Any raised decks and porches should have guardrails on the edges. Have any leaves, snow, or ice cleared regularly. Use sand or salt on walking paths during winter. Clean up any spills in your garage right away. This includes oil or grease spills. What can I do in the bathroom? Use night lights. Install grab bars by the toilet and in the tub and shower. Do not use towel bars as grab bars. Use non-skid mats or decals in the tub or shower. If you need to sit down in the shower, use a plastic, non-slip stool. Keep the floor dry. Clean up any water that spills on the floor as soon as it happens. Remove soap buildup in the tub or shower regularly. Attach bath mats securely with double-sided  non-slip rug tape. Do not have throw rugs and other things on the floor that can make you trip. What can I do in the bedroom? Use night lights. Make sure that you have a light by your bed that is easy to reach. Do not use any sheets or blankets that are too big for your bed. They should not hang down onto the floor. Have a firm chair that has side arms. You can use this for support while you get dressed. Do not have throw rugs and other things on the floor that can make you trip. What can I do in the kitchen? Clean up any spills right away. Avoid walking on wet floors. Keep items that you use a lot in easy-to-reach places. If you need to reach something above you, use a strong step stool that has a grab bar. Keep electrical cords out of the way. Do not use floor polish or wax that makes floors slippery. If you must use wax, use non-skid floor wax. Do not have throw rugs and other things on the floor that can make you trip. What can I do with my stairs? Do not leave any items on the stairs. Make sure that  there are handrails on both sides of the stairs and use them. Fix handrails that are broken or loose. Make sure that handrails are as long as the stairways. Check any carpeting to make sure that it is firmly attached to the stairs. Fix any carpet that is loose or worn. Avoid having throw rugs at the top or bottom of the stairs. If you do have throw rugs, attach them to the floor with carpet tape. Make sure that you have a light switch at the top of the stairs and the bottom of the stairs. If you do not have them, ask someone to add them for you. What else can I do to help prevent falls? Wear shoes that: Do not have high heels. Have rubber bottoms. Are comfortable and fit you well. Are closed at the toe. Do not wear sandals. If you use a stepladder: Make sure that it is fully opened. Do not climb a closed stepladder. Make sure that both sides of the stepladder are locked into place. Ask  someone to hold it for you, if possible. Clearly mark and make sure that you can see: Any grab bars or handrails. First and last steps. Where the edge of each step is. Use tools that help you move around (mobility aids) if they are needed. These include: Canes. Walkers. Scooters. Crutches. Turn on the lights when you go into a dark area. Replace any light bulbs as soon as they burn out. Set up your furniture so you have a clear path. Avoid moving your furniture around. If any of your floors are uneven, fix them. If there are any pets around you, be aware of where they are. Review your medicines with your doctor. Some medicines can make you feel dizzy. This can increase your chance of falling. Ask your doctor what other things that you can do to help prevent falls. This information is not intended to replace advice given to you by your health care provider. Make sure you discuss any questions you have with your health care provider. Document Released: 12/17/2008 Document Revised: 07/29/2015 Document Reviewed: 03/27/2014 Elsevier Interactive Patient Education  2017 [employer].

## 2022-08-09 NOTE — Progress Notes (Signed)
I connected with  Saryia Gens Duren on 08/09/22 by a audio enabled telemedicine application and verified that I am speaking with the correct person using two identifiers.  Patient Location: Home  Provider Location: Office/Clinic  I discussed the limitations of evaluation and management by telemedicine. The patient expressed understanding and agreed to proceed.  Subjective:   Kaneshia Fornshell is a 72 y.o. female who presents for Medicare Annual (Subsequent) preventive examination.  Review of Systems     Cardiac Risk Factors include: advanced age (>71men, >11 women);hypertension;dyslipidemia     Objective:    Today's Vitals   08/09/22 1429 08/09/22 1448  Weight:  194 lb (88 kg)  Height:  5' 1.5" (1.562 m)  PainSc: 5     Body mass index is 36.06 kg/m.     08/09/2022    2:36 PM 08/03/2021    2:35 PM 10/15/2020    8:52 AM 07/26/2020    2:48 PM 07/03/2019    7:40 PM 06/16/2019    3:35 PM  Advanced Directives  Does Patient Have a Medical Advance Directive? No Yes Yes Yes Yes Yes  Type of Special educational needs teacher of [place];Living will Healthcare Power of [place];Living will Healthcare Power of [place];Living will Healthcare Power of [place];Living will Healthcare Power of [place];Living will  Does patient want to make changes to medical advance directive?   No - Patient declined     Copy of Healthcare Power of Attorney in Chart?  No - copy requested Yes - validated most recent copy scanned in chart (See row information) No - copy requested  No - copy requested  Would patient like information on creating a medical advance directive? No - Patient declined         Current Medications (verified) Outpatient Encounter Medications as of 08/09/2022  Medication Sig   Cholecalciferol (VITAMIN D-1000 MAX ST) 25 MCG (1000 UT) tablet Take 1,000 Units by mouth daily.   escitalopram (LEXAPRO) 10 MG tablet Take 1 tablet (10 mg total) by mouth daily.   hydrochlorothiazide (HYDRODIURIL) 25  MG tablet TAKE 1 TABLET (25 MG TOTAL) BY MOUTH DAILY.   HYDROcodone-acetaminophen (NORCO/VICODIN) 5-325 MG tablet Take 0.5-1 tablets by mouth daily as needed.   Milk Thistle 175 MG CAPS Take 1 capsule by mouth in the morning and at bedtime.   Multiple Vitamins-Minerals (CENTRUM ADULTS) TABS Take by mouth.   omeprazole (PRILOSEC OTC) 20 MG tablet Take 20 mg by mouth daily.   potassium chloride (KLOR-CON) 10 MEQ tablet TAKE 1 TABLET BY MOUTH EVERY DAY   S-Adenosylmethionine (SAM-E PO) Take by mouth.   No facility-administered encounter medications on file as of 08/09/2022.    Allergies (verified) Cymbalta [duloxetine hcl], Flulaval quadrivalent [influenza vac split quad], Pineapple, and Latex   History: Past Medical History:  Diagnosis Date   Bell's palsy    DDD (degenerative disc disease), lumbar    Essential hypertension    Fatigue    Fibromyalgia    GERD (gastroesophageal reflux disease)    Hemangioma of intra-abdominal structures    History of Lyme disease    Hyperlipidemia, mixed    PONV (postoperative nausea and vomiting)    Wears contact lenses    Right eye only   Past Surgical History:  Procedure Laterality Date   COLONOSCOPY     COLONOSCOPY WITH PROPOFOL N/A 10/15/2020   Procedure: COLONOSCOPY WITH PROPOFOL;  Surgeon: Midge Minium, MD;  Location: [facility];  Service: Endoscopy;  Laterality: N/A;  Latex   TUBAL  LIGATION     Family History  Problem Relation Age of Onset   Hypertension Mother    Osteoporosis Mother    Breast cancer Mother        ~39   Alcohol abuse Mother    Cancer Mother    Hypertension Father    Heart attack Father    Stroke Father    Heart disease Father    Kidney disease Maternal Grandmother    Osteoporosis Maternal Grandmother    Alcohol abuse Maternal Grandmother    Arthritis Maternal Grandmother    Kidney disease Maternal Grandfather    Cancer Maternal Grandfather    Kidney disease Paternal Grandmother    Cancer Paternal  Grandmother    Kidney disease Paternal Grandfather    Breast cancer Paternal Aunt        3 pat aunts   Cancer Paternal Aunt    Cancer Paternal Aunt    Social History   Socioeconomic History   Marital status: Married    Spouse name: Not on file   Number of children: 0   Years of education: Not on file   Highest education level: Not on file  Occupational History   Not on file  Tobacco Use   Smoking status: Never   Smokeless tobacco: Never  Vaping Use   Vaping Use: Never used  Substance and Sexual Activity   Alcohol use: Yes    Alcohol/week: 4.0 standard drinks of alcohol    Types: 4 Glasses of wine per week    Comment: occassional   Drug use: No   Sexual activity: Yes    Birth control/protection: Post-menopausal  Other Topics Concern   Not on file  Social History Narrative   Not on file   Social Determinants of Health   Financial Resource Strain: Low Risk  (08/03/2021)   Overall Financial Resource Strain (CARDIA)    Difficulty of Paying Living Expenses: Not hard at all  Food Insecurity: No Food Insecurity (08/03/2021)   Hunger Vital Sign    Worried About Running Out of Food in the Last Year: Never true    Ran Out of Food in the Last Year: Never true  Transportation Needs: No Transportation Needs (08/03/2021)   PRAPARE - Administrator, Civil Service (Medical): No    Lack of Transportation (Non-Medical): No  Physical Activity: Sufficiently Active (08/09/2022)   Exercise Vital Sign    Days of Exercise per Week: 7 days    Minutes of Exercise per Session: 60 min  Stress: Stress Concern Present (08/09/2022)   [employer] of Occupational Health - Occupational Stress Questionnaire    Feeling of Stress : To some extent  Social Connections: Moderately Isolated (08/03/2021)   Social Connection and Isolation Panel [NHANES]    Frequency of Communication with Friends and Family: More than three times a week    Frequency of Social Gatherings with Friends and Family:  Twice a week    Attends Religious Services: Never    Database administrator or Organizations: No    Attends Engineer, structural: Never    Marital Status: Married    Tobacco Counseling Counseling given: Not Answered   Clinical Intake:  Pre-visit preparation completed: Yes  Pain : 0-10 Pain Score: 5  Pain Type: Chronic pain Pain Location: Knee Pain Orientation: Right, Left     Nutritional Risks: None Diabetes: No  How often do you need to have someone help you when you read instructions, pamphlets, or other written  materials from your doctor or pharmacy?: 1 - Never  Diabetic?no  Interpreter Needed?: No  Information entered by :: Kennedy Bucker, LPN   Activities of Daily Living    08/09/2022    2:38 PM 08/05/2022   11:07 AM  In your present state of health, do you have any difficulty performing the following activities:  Hearing? 0 0  Vision? 0 0  Difficulty concentrating or making decisions? 0 0  Walking or climbing stairs? 1 1  Comment arthritis in knees   Dressing or bathing? 0 0  Doing errands, shopping? 0 0  Preparing Food and eating ? N N  Using the Toilet? N N  In the past six months, have you accidently leaked urine? N N  Do you have problems with loss of bowel control? N N  Managing your Medications? N N  Managing your Finances? N N  Housekeeping or managing your Housekeeping? N N    Patient Care Team: Reubin Milan, MD as PCP - General (Internal Medicine) Felecia Shelling (Pain Medicine) Midge Minium, MD as Consulting Physician (Gastroenterology) Marissa Nestle, MD as Referring Physician (Physical Medicine and Rehabilitation)  Indicate any recent Medical Services you may have received from other than [facility] providers in the past year (date may be approximate).     Assessment:   This is a routine wellness examination for Chyrell Lightner.  Hearing/Vision screen Hearing Screening - Comments:: No aids Vision Screening -  Comments:: LASIX done 30 yrs ago Contact for distance, rx for glasses to drive- [facility]  Dietary issues and exercise activities discussed: Current Exercise Habits: Home exercise routine, Type of exercise: Other - see comments (farm; stat bike), Time (Minutes): 60, Frequency (Times/Week): 7, Weekly Exercise (Minutes/Week): 420, Intensity: Mild   Goals Addressed             This Visit's Progress    DIET - EAT MORE FRUITS AND VEGETABLES         Depression Screen    08/09/2022    2:35 PM 08/02/2022    9:54 AM 03/01/2022    1:28 PM 01/30/2022   10:51 AM 10/28/2021    9:03 AM 08/03/2021    2:31 PM 07/27/2021   10:55 AM  PHQ 2/9 Scores  PHQ - 2 Score 2 4 2 2 4 4 4   PHQ- 9 Score 4 15 3 8 10 9 13     Fall Risk    08/09/2022    2:38 PM 08/05/2022   11:07 AM 08/02/2022    9:54 AM 03/01/2022    1:28 PM 01/30/2022   10:52 AM  Fall Risk   Falls in the past year? 0 0 0 0 0  Number falls in past yr: 0  0 0 0  Injury with Fall? 0  0 0 0  Risk for fall due to : No Fall Risks  No Fall Risks No Fall Risks No Fall Risks  Follow up Falls prevention discussed;Falls evaluation completed  Falls evaluation completed Falls evaluation completed Falls evaluation completed    FALL RISK PREVENTION PERTAINING TO THE HOME:  Any stairs in or around the home? Yes  If so, are there any without handrails? No  Home free of loose throw rugs in walkways, pet beds, electrical cords, etc? Yes  Adequate lighting in your home to reduce risk of falls? Yes   ASSISTIVE DEVICES UTILIZED TO PREVENT FALLS:  Life alert? No  Use of a cane, walker or w/c? No  Grab  bars in the bathroom? Yes  Shower chair or bench in shower? Yes  Elevated toilet seat or a handicapped toilet? No    Cognitive Function:        08/09/2022    2:43 PM  6CIT Screen  What Year? 0 points  What month? 0 points  What time? 0 points  Count back from 20 0 points  Months in reverse 0 points  Repeat phrase 0 points  Total  Score 0 points    Immunizations Immunization History  Administered Date(s) Administered   PFIZER(Purple Top)SARS-COV-2 Vaccination 06/06/2019, 07/18/2019   Td 01/28/2014    TDAP status: Up to date  Flu Vaccine status: Declined, Education has been provided regarding the importance of this vaccine but patient still declined. Advised may receive this vaccine at local pharmacy or Health Dept. Aware to provide a copy of the vaccination record if obtained from local pharmacy or Health Dept. Verbalized acceptance and understanding.  Pneumococcal vaccine status: Declined,  Education has been provided regarding the importance of this vaccine but patient still declined. Advised may receive this vaccine at local pharmacy or Health Dept. Aware to provide a copy of the vaccination record if obtained from local pharmacy or Health Dept. Verbalized acceptance and understanding.   Covid-19 vaccine status: Completed vaccines  Qualifies for Shingles Vaccine? Yes   Zostavax completed No   Shingrix Completed?: No.    Education has been provided regarding the importance of this vaccine. Patient has been advised to call insurance company to determine out of pocket expense if they have not yet received this vaccine. Advised may also receive vaccine at local pharmacy or Health Dept. Verbalized acceptance and understanding.  Screening Tests Health Maintenance  Topic Date Due   MAMMOGRAM  12/27/2022   Medicare Annual Wellness (AWV)  08/09/2023   DTaP/Tdap/Td (2 - Tdap) 01/29/2024   Colonoscopy  10/16/2030   DEXA SCAN  Completed   Hepatitis C Screening  Completed   HPV VACCINES  Aged Out   Pneumonia Vaccine 82+ Years old  Discontinued   COVID-19 Vaccine  Discontinued   Zoster Vaccines- Shingrix  Discontinued    Health Maintenance  There are no preventive care reminders to display for this patient.   Colorectal cancer screening: Type of screening: Colonoscopy. Completed 10/15/20. Repeat every 10  years  Mammogram status: Ordered 08/02/22. Pt provided with contact info and advised to call to schedule appt.   Bone Density status: Completed 11/05/18. Results reflect: Bone density results: NORMAL. Repeat every 5 years.  Lung Cancer Screening: (Low Dose CT Chest recommended if Age 24-80 years, 30 pack-year currently smoking OR have quit w/in 15years.) does not qualify.   Additional Screening:  Hepatitis C Screening: does qualify; Completed 10/07/15  Vision Screening: Recommended annual ophthalmology exams for early detection of glaucoma and other disorders of the eye. Is the patient up to date with their annual eye exam?  Yes  Who is the provider or what is the name of the office in which the patient attends annual eye exams? [facility] If pt is not established with a provider, would they like to be referred to a provider to establish care? No .   Dental Screening: Recommended annual dental exams for proper oral hygiene  Community Resource Referral / Chronic Care Management: CRR required this visit?  No   CCM required this visit?  No      Plan:     I have personally reviewed and noted the following in the patient's chart:  Medical and social history Use of alcohol, tobacco or illicit drugs  Current medications and supplements including opioid prescriptions. Patient is currently taking opioid prescriptions. Information provided to patient regarding non-opioid alternatives. Patient advised to discuss non-opioid treatment plan with their provider. Functional ability and status Nutritional status Physical activity Advanced directives List of other physicians Hospitalizations, surgeries, and ER visits in previous 12 months Vitals Screenings to include cognitive, depression, and falls Referrals and appointments  In addition, I have reviewed and discussed with patient certain preventive protocols, quality metrics, and best practice recommendations. A written personalized  care plan for preventive services as well as general preventive health recommendations were provided to patient.     Hal Hope, LPN   03/06/9145   Nurse Notes: none

## 2022-08-11 ENCOUNTER — Encounter: Payer: Self-pay | Admitting: Internal Medicine

## 2022-09-30 ENCOUNTER — Other Ambulatory Visit: Payer: Self-pay | Admitting: Internal Medicine

## 2022-09-30 DIAGNOSIS — I10 Essential (primary) hypertension: Secondary | ICD-10-CM

## 2022-10-10 ENCOUNTER — Other Ambulatory Visit: Payer: Self-pay | Admitting: Internal Medicine

## 2022-10-10 DIAGNOSIS — I10 Essential (primary) hypertension: Secondary | ICD-10-CM

## 2022-10-30 DIAGNOSIS — G894 Chronic pain syndrome: Secondary | ICD-10-CM | POA: Diagnosis not present

## 2022-10-30 DIAGNOSIS — M170 Bilateral primary osteoarthritis of knee: Secondary | ICD-10-CM | POA: Diagnosis not present

## 2022-10-30 DIAGNOSIS — Z79899 Other long term (current) drug therapy: Secondary | ICD-10-CM | POA: Diagnosis not present

## 2022-12-11 ENCOUNTER — Other Ambulatory Visit: Payer: Self-pay | Admitting: Internal Medicine

## 2022-12-11 DIAGNOSIS — F411 Generalized anxiety disorder: Secondary | ICD-10-CM

## 2023-01-03 ENCOUNTER — Ambulatory Visit
Admission: RE | Admit: 2023-01-03 | Discharge: 2023-01-03 | Disposition: A | Discharge status: Home / Self Care | Payer: PPO | Source: Ambulatory Visit | Attending: Internal Medicine | Admitting: Internal Medicine

## 2023-01-03 DIAGNOSIS — Z1231 Encounter for screening mammogram for malignant neoplasm of breast: Secondary | ICD-10-CM | POA: Diagnosis not present

## 2023-01-15 ENCOUNTER — Encounter: Payer: Self-pay | Admitting: Internal Medicine

## 2023-01-15 ENCOUNTER — Ambulatory Visit (INDEPENDENT_AMBULATORY_CARE_PROVIDER_SITE_OTHER): Payer: PPO | Admitting: Internal Medicine

## 2023-01-15 VITALS — BP 126/78 | HR 76 | Ht 61.5 in | Wt 187.0 lb

## 2023-01-15 DIAGNOSIS — I10 Essential (primary) hypertension: Secondary | ICD-10-CM

## 2023-01-15 DIAGNOSIS — F411 Generalized anxiety disorder: Secondary | ICD-10-CM

## 2023-01-15 NOTE — Assessment & Plan Note (Signed)
Depression worsening recently, husbands IBD recurred and he is not feeling well. On Lexapro 10 mg currently - will increase to 15 mg then 20 mg if needed. Follow up if worsening

## 2023-01-15 NOTE — Assessment & Plan Note (Signed)
 Controlled BP with normal exam. Current regimen is hctz. Will continue same medications; encourage continued reduced sodium diet.

## 2023-01-15 NOTE — Progress Notes (Signed)
Date:  01/15/2023   Name:  Jessica Mcgee [facility]   DOB:  12-21-1950   MRN:  [id number]   Chief Complaint: Hypertension and Depression  Hypertension This is a chronic problem. The problem is controlled. Pertinent negatives include no chest pain, headaches, palpitations or shortness of breath. Past treatments include diuretics. The current treatment provides significant improvement. There is no history of kidney disease, CAD/MI or CVA.  Depression        This is a chronic problem.  The problem has been gradually worsening since onset.  Associated symptoms include no fatigue, no headaches and no suicidal ideas.  Past treatments include SSRIs - Selective serotonin reuptake inhibitors.  Compliance with treatment is good. Struggling with husbands bowel disease, aging animals and sick pets.  She is on Lexapro and it helped tremendously in the beginning.  Review of Systems  Constitutional:  Negative for fatigue and unexpected weight change.  HENT:  Negative for nosebleeds.   Eyes:  Negative for visual disturbance.  Respiratory:  Negative for cough, chest tightness, shortness of breath and wheezing.   Cardiovascular:  Negative for chest pain, palpitations and leg swelling.  Gastrointestinal:  Negative for abdominal pain, constipation and diarrhea.  Neurological:  Negative for dizziness, weakness, light-headedness and headaches.  Psychiatric/Behavioral:  Positive for depression, dysphoric mood and sleep disturbance. Negative for suicidal ideas. The patient is nervous/anxious.      Lab Results  Component Value Date   NA 140 08/02/2022   K 4.0 08/02/2022   CO2 25 08/02/2022   GLUCOSE 87 08/02/2022   BUN 14 08/02/2022   CREATININE 0.58 08/02/2022   CALCIUM 9.6 08/02/2022   EGFR 98 08/02/2022   GFRNONAA 77 05/07/2019   Lab Results  Component Value Date   CHOL 166 08/02/2022   HDL 53 08/02/2022   LDLCALC 98 08/02/2022   TRIG 82 08/02/2022   CHOLHDL 3.1 08/02/2022   Lab Results  Component  Value Date   TSH 2.110 08/02/2022   No results found for: "HGBA1C" Lab Results  Component Value Date   WBC 7.6 08/02/2022   HGB 12.5 08/02/2022   HCT 37.7 08/02/2022   MCV 87 08/02/2022   PLT 411 08/02/2022   Lab Results  Component Value Date   ALT 16 08/02/2022   AST 14 08/02/2022   ALKPHOS 62 08/02/2022   BILITOT 0.2 08/02/2022   Lab Results  Component Value Date   VD25OH 41.9 08/25/2014     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   GERD (gastroesophageal reflux disease) 10/07/2015   Essential hypertension 02/10/2015   Fibromyalgia 08/25/2014    Allergies  Allergen Reactions   Cymbalta [Duloxetine Hcl]     Marked fatigue   Flulaval Quadrivalent [Influenza Vac Split Quad]     Unable to take due to hx of lime disease and bell's palsy   Pineapple Swelling    Tongue swelling, mouth ulcers   Latex Rash    (With extended contact)    Past Surgical History:  Procedure Laterality Date   COLONOSCOPY     COLONOSCOPY WITH PROPOFOL N/A 10/15/2020   Procedure: COLONOSCOPY WITH PROPOFOL;  Surgeon: Midge Minium, MD;  [facility];  Service:  Endoscopy;  Laterality: N/A;  Latex   TUBAL LIGATION      Social History   Tobacco Use   Smoking status: Never   Smokeless tobacco: Never  Vaping Use   Vaping status: Never Used  Substance Use Topics   Alcohol use: Yes    Alcohol/week: 4.0 standard drinks of alcohol    Types: 4 Glasses of wine per week    Comment: occassional   Drug use: No     Medication list has been reviewed and updated.  Current Meds  Medication Sig   Cholecalciferol (VITAMIN D-1000 MAX ST) 25 MCG (1000 UT) tablet  Take 1,000 Units by mouth daily.   escitalopram (LEXAPRO) 10 MG tablet TAKE 1 TABLET BY MOUTH EVERY DAY   hydrochlorothiazide (HYDRODIURIL) 25 MG tablet TAKE 1 TABLET (25 MG TOTAL) BY MOUTH DAILY.   HYDROcodone-acetaminophen (NORCO/VICODIN) 5-325 MG tablet Take 0.5-1 tablets by mouth daily as needed.   Milk Thistle 175 MG CAPS Take 1 capsule by mouth in the morning and at bedtime.   Multiple Vitamins-Minerals (CENTRUM ADULTS) TABS Take by mouth.   omeprazole (PRILOSEC OTC) 20 MG tablet Take 20 mg by mouth daily.   potassium chloride (KLOR-CON) 10 MEQ tablet TAKE 1 TABLET BY MOUTH EVERY DAY   S-Adenosylmethionine (SAM-E PO) Take by mouth.       01/15/2023    2:52 PM 08/02/2022    9:54 AM 03/01/2022    1:28 PM 01/30/2022   10:52 AM  GAD 7 : Generalized Anxiety Score  Nervous, Anxious, on Edge 3 0 1 2  Control/stop worrying 3 0 1 2  Worry too much - different things 3 0 1 2  Trouble relaxing 3 0 1 2  Restless 3 0 1 2  Easily annoyed or irritable 3 0 1 1  Afraid - awful might happen 3 0 2 3  Total GAD 7 Score 21 0 8 14  Anxiety Difficulty Extremely difficult Not difficult at all Somewhat difficult Very difficult       01/15/2023    2:52 PM 08/09/2022    2:35 PM 08/02/2022    9:54 AM  Depression screen PHQ 2/9  Decreased Interest 2 1 2   Down, Depressed, Hopeless 2 1 2   PHQ - 2 Score 4 2 4   Altered sleeping 1 1 2   Tired, decreased energy 1 1 3   Change in appetite 3 0 2  Feeling bad or failure about yourself  0 0 1  Trouble concentrating 0 0 2  Moving slowly or fidgety/restless 0 0 1  Suicidal thoughts 0 0 0  PHQ-9 Score 9 4 15   Difficult doing work/chores Not difficult at all Somewhat difficult Very difficult    BP Readings from Last 3 Encounters:  01/15/23 126/78  08/02/22 129/76  03/01/22 128/72    Physical Exam Vitals and nursing note reviewed.  Constitutional:      General: She is not in acute distress.    Appearance: She is well-developed.  HENT:     Head:  Normocephalic and atraumatic.  Cardiovascular:     Rate and Rhythm: Normal rate and regular rhythm.     Heart sounds: No murmur heard. Pulmonary:     Effort: Pulmonary effort is normal. No respiratory distress.     Breath sounds: No wheezing or rhonchi.  Musculoskeletal:     Cervical back: Normal range of motion.     Right lower leg: Edema present.     Left lower leg: Edema present.  Skin:  General: Skin is warm and dry.     Findings: No rash.  Neurological:     Mental Status: She is alert and oriented to person, place, and time.  Psychiatric:        Mood and Affect: Mood is depressed.        Speech: Speech normal.        Behavior: Behavior normal.     Wt Readings from Last 3 Encounters:  01/15/23 187 lb (84.8 kg)  08/09/22 194 lb (88 kg)  08/02/22 194 lb 9.6 oz (88.3 kg)    BP 126/78   Pulse 76   Ht 5' 1.5" (1.562 m)   Wt 187 lb (84.8 kg)   SpO2 97%   BMI 34.76 kg/m   Assessment and Plan:  Problem List Items Addressed This Visit       Unprioritized   Essential hypertension - Primary (Chronic)    Controlled BP with normal exam. Current regimen is hctz. Will continue same medications; encourage continued reduced sodium diet.       Generalized anxiety disorder (Chronic)    Depression worsening recently, husbands IBD recurred and he is not feeling well. On Lexapro 10 mg currently - will increase to 15 mg then 20 mg if needed. Follow up if worsening       No follow-ups on file.    Reubin Milan, MD [facility] [place]

## 2023-01-15 NOTE — Patient Instructions (Signed)
Increase Lexapro to 1 and 1/2 tablet for a week then up to 2 tablets daily.  Call for a new Rx if helpful.

## 2023-04-04 ENCOUNTER — Other Ambulatory Visit: Payer: Self-pay | Admitting: Internal Medicine

## 2023-04-04 DIAGNOSIS — I10 Essential (primary) hypertension: Secondary | ICD-10-CM

## 2023-04-04 NOTE — Telephone Encounter (Signed)
Requested Prescriptions  Pending Prescriptions Disp Refills   potassium chloride (KLOR-CON) 10 MEQ tablet [Pharmacy Med Name: POTASSIUM CL ER 10 MEQ TAB WAX] 90 tablet 0    Sig: TAKE 1 TABLET BY MOUTH EVERY DAY     Endocrinology:  Minerals - Potassium Supplementation Passed - 04/04/2023 10:22 AM      Passed - K in normal range and within 360 days    Potassium  Date Value Ref Range Status  08/02/2022 4.0 3.5 - 5.2 mmol/L Final         Passed - Cr in normal range and within 360 days    Creatinine, Ser  Date Value Ref Range Status  08/02/2022 0.58 0.57 - 1.00 mg/dL Final         Passed - Valid encounter within last 12 months    Recent Outpatient Visits           2 months ago Essential hypertension   [place] [facility] at [facility], Nyoka Cowden, MD   8 months ago Annual physical exam   [facility] at [facility], Nyoka Cowden, MD   1 year ago Generalized anxiety disorder   [facility] at [facility], Nyoka Cowden, MD   1 year ago Essential hypertension   [place] [facility] at [facility], Nyoka Cowden, MD   1 year ago Sinus congestion   [facility] at [facility], Nyoka Cowden, MD

## 2023-04-27 DIAGNOSIS — Z79899 Other long term (current) drug therapy: Secondary | ICD-10-CM | POA: Diagnosis not present

## 2023-04-27 DIAGNOSIS — G894 Chronic pain syndrome: Secondary | ICD-10-CM | POA: Diagnosis not present

## 2023-04-27 DIAGNOSIS — M170 Bilateral primary osteoarthritis of knee: Secondary | ICD-10-CM | POA: Diagnosis not present

## 2023-06-17 ENCOUNTER — Other Ambulatory Visit: Payer: Self-pay | Admitting: Internal Medicine

## 2023-06-17 DIAGNOSIS — I10 Essential (primary) hypertension: Secondary | ICD-10-CM

## 2023-06-18 NOTE — Telephone Encounter (Signed)
 Requested Prescriptions  Pending Prescriptions Disp Refills   hydrochlorothiazide (HYDRODIURIL) 25 MG tablet [Pharmacy Med Name: HYDROCHLOROTHIAZIDE 25 MG TAB] 90 tablet 0    Sig: TAKE 1 TABLET (25 MG TOTAL) BY MOUTH DAILY.     Cardiovascular: Diuretics - Thiazide Failed - 06/18/2023  4:28 PM      Failed - Cr in normal range and within 180 days    Creatinine, Ser  Date Value Ref Range Status  08/02/2022 0.58 0.57 - 1.00 mg/dL Final         Failed - K in normal range and within 180 days    Potassium  Date Value Ref Range Status  08/02/2022 4.0 3.5 - 5.2 mmol/L Final         Failed - Na in normal range and within 180 days    Sodium  Date Value Ref Range Status  08/02/2022 140 134 - 144 mmol/L Final         Failed - Valid encounter within last 6 months    Recent Outpatient Visits   None            Passed - Last BP in normal range    BP Readings from Last 1 Encounters:  01/15/23 126/78

## 2023-06-30 ENCOUNTER — Other Ambulatory Visit: Payer: Self-pay | Admitting: Internal Medicine

## 2023-06-30 DIAGNOSIS — I10 Essential (primary) hypertension: Secondary | ICD-10-CM

## 2023-07-02 NOTE — Telephone Encounter (Signed)
 Requested Prescriptions  Pending Prescriptions Disp Refills   potassium chloride  (KLOR-CON ) 10 MEQ tablet [Pharmacy Med Name: POTASSIUM CL ER 10 MEQ TAB WAX] 90 tablet 0    Sig: TAKE 1 TABLET BY MOUTH EVERY DAY     Endocrinology:  Minerals - Potassium Supplementation Failed - 07/02/2023 10:59 AM      Failed - Valid encounter within last 12 months    Recent Outpatient Visits   None            Passed - K in normal range and within 360 days    Potassium  Date Value Ref Range Status  08/02/2022 4.0 3.5 - 5.2 mmol/L Final         Passed - Cr in normal range and within 360 days    Creatinine, Ser  Date Value Ref Range Status  08/02/2022 0.58 0.57 - 1.00 mg/dL Final

## 2023-08-15 ENCOUNTER — Ambulatory Visit: Payer: PPO

## 2023-08-15 VITALS — Ht 62.0 in | Wt 181.0 lb

## 2023-08-15 DIAGNOSIS — Z780 Asymptomatic menopausal state: Secondary | ICD-10-CM | POA: Diagnosis not present

## 2023-08-15 DIAGNOSIS — Z0000 Encounter for general adult medical examination without abnormal findings: Secondary | ICD-10-CM | POA: Diagnosis not present

## 2023-08-15 DIAGNOSIS — Z1231 Encounter for screening mammogram for malignant neoplasm of breast: Secondary | ICD-10-CM

## 2023-08-15 NOTE — Progress Notes (Deleted)
 Date:  09/17/2023   Name:  Jessica Mcgee [facility]   DOB:  [birth year]   MRN:  [id number]   Chief Complaint: No chief complaint on file.  HPI  Review of Systems   Lab Results  Component Value Date   NA 140 08/02/2022   K 4.0 08/02/2022   CO2 25 08/02/2022   GLUCOSE 87 08/02/2022   BUN 14 08/02/2022   CREATININE 0.58 08/02/2022   CALCIUM 9.6 08/02/2022   EGFR 98 08/02/2022   GFRNONAA 77 05/07/2019   Lab Results  Component Value Date   CHOL 166 08/02/2022   HDL 53 08/02/2022   LDLCALC 98 08/02/2022   TRIG 82 08/02/2022   CHOLHDL 3.1 08/02/2022   Lab Results  Component Value Date   TSH 2.110 08/02/2022   No results found for: HGBA1C Lab Results  Component Value Date   WBC 7.6 08/02/2022   HGB 12.5 08/02/2022   HCT 37.7 08/02/2022   MCV 87 08/02/2022   PLT 411 08/02/2022   Lab Results  Component Value Date   ALT 16 08/02/2022   AST 14 08/02/2022   ALKPHOS 62 08/02/2022   BILITOT 0.2 08/02/2022   Lab Results  Component Value Date   VD25OH 41.9 08/25/2014     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   GERD (gastroesophageal reflux disease) 10/07/2015   Essential hypertension 02/10/2015   Fibromyalgia 08/25/2014    Allergies  Allergen Reactions   Cymbalta [Duloxetine Hcl]     Marked fatigue   Flulaval Quadrivalent [Influenza Vac Split Quad]     Unable to take due to hx of lime disease and bell's palsy   Pineapple Swelling    Tongue swelling, mouth ulcers   Latex Rash    (With extended contact)    Past Surgical History:  Procedure Laterality Date   COLONOSCOPY     COLONOSCOPY WITH PROPOFOL  N/A 10/15/2020   Procedure: COLONOSCOPY WITH  PROPOFOL ;  Surgeon: Marnee Sink, MD;  Location: [facility];  Service: Endoscopy;  Laterality: N/A;  Latex   TUBAL LIGATION      Social History   Tobacco Use   Smoking status: Never    Passive exposure: Past   Smokeless tobacco: Never  Vaping Use   Vaping status: Never Used  Substance Use Topics   Alcohol use: Yes    Comment: 1 glass of wine every 3-4 months   Drug use: No     Medication list has been reviewed and updated.  No outpatient medications have been marked as taking for the 09/17/23 encounter (Appointment) with Barnetta Liberty, MD.       01/15/2023    2:52 PM 08/02/2022    9:54 AM 03/01/2022    1:28 PM 01/30/2022   10:52 AM  GAD 7 : Generalized Anxiety Score  Nervous, Anxious, on Edge 3 0 1 2  Control/stop worrying 3 0 1 2  Worry too much - different things 3 0 1 2  Trouble relaxing 3 0 1 2  Restless 3 0 1 2  Easily annoyed or irritable 3 0 1 1  Afraid - awful might happen 3 0 2 3  Total GAD 7 Score 21 0 8  14  Anxiety Difficulty Extremely difficult Not difficult at all Somewhat difficult Very difficult       08/15/2023    1:29 PM 01/15/2023    2:52 PM 08/09/2022    2:35 PM  Depression screen PHQ 2/9  Decreased Interest 1 2 1   Down, Depressed, Hopeless 1 2 1   PHQ - 2 Score 2 4 2   Altered sleeping 1 1 1   Tired, decreased energy 1 1 1   Change in appetite 0 3 0  Feeling bad or failure about yourself  0 0 0  Trouble concentrating 0 0 0  Moving slowly or fidgety/restless 0 0 0  Suicidal thoughts 0 0 0  PHQ-9 Score 4 9 4   Difficult doing work/chores Somewhat difficult Not difficult at all Somewhat difficult    BP Readings from Last 3 Encounters:  01/15/23 126/78  08/02/22 129/76  03/01/22 128/72    Physical Exam  Wt Readings from Last 3 Encounters:  08/15/23 181 lb (82.1 kg)  01/15/23 187 lb (84.8 kg)  08/09/22 194 lb (88 kg)    There were no vitals taken for this visit.  Assessment and Plan:  Problem List Items Addressed This  Visit   None   No follow-ups on file.    Sheron Dixons, MD [facility] [place]

## 2023-08-15 NOTE — Progress Notes (Signed)
 Subjective:   Jessica Mcgee is a 73 y.o. who presents for a Medicare Wellness preventive visit.  As a reminder, Annual Wellness Visits don't include a physical exam, and some assessments may be limited, especially if this visit is performed virtually. We may recommend an in-person follow-up visit with your provider if needed.  Visit Complete: Virtual I connected with  Jessica Mcgee on 08/15/23 by a audio enabled telemedicine application and verified that I am speaking with the correct person using two identifiers.  Patient Location: Home  Provider Location: Home Office  I discussed the limitations of evaluation and management by telemedicine. The patient expressed understanding and agreed to proceed.  Vital Signs: Because this visit was a virtual/telehealth visit, some criteria may be missing or patient reported. Any vitals not documented were not able to be obtained and vitals that have been documented are patient reported.  VideoDeclined- This patient declined Librarian, academic. Therefore the visit was completed with audio only.  Persons Participating in Visit: Patient.  AWV Questionnaire: Yes: Patient Medicare AWV questionnaire was completed by the patient on 08/11/23; I have confirmed that all information answered by patient is correct and no changes since this date.  Cardiac Risk Factors include: advanced age (>53men, >60 women);dyslipidemia;hypertension;obesity (BMI >30kg/m2)     Objective:     Today's Vitals   08/15/23 1318 08/15/23 1319  Weight: 181 lb (82.1 kg)   Height: 5' 2 (1.575 m)   PainSc:  4    Body mass index is 33.11 kg/m.     08/15/2023    1:35 PM 08/09/2022    2:36 PM 08/03/2021    2:35 PM 10/15/2020    8:52 AM 07/26/2020    2:48 PM 07/03/2019    7:40 PM 06/16/2019    3:35 PM  Advanced Directives  Does Patient Have a Medical Advance Directive? Yes No Yes Yes Yes Yes Yes  Type of Estate agent of  [place];Living will  Healthcare Power of [place];Living will Healthcare Power of [place];Living will Healthcare Power of [place];Living will Healthcare Power of [place];Living will Healthcare Power of [place];Living will  Does patient want to make changes to medical advance directive? No - Patient declined   No - Patient declined     Copy of Healthcare Power of Attorney in Chart? No - copy requested  No - copy requested Yes - validated most recent copy scanned in chart (See row information) No - copy requested  No - copy requested  Would patient like information on creating a medical advance directive?  No - Patient declined         Current Medications (verified) Outpatient Encounter Medications as of 08/15/2023  Medication Sig   Cholecalciferol (VITAMIN D -1000 MAX ST) 25 MCG (1000 UT) tablet Take 1,000 Units by mouth daily.   escitalopram  (LEXAPRO ) 10 MG tablet TAKE 1 TABLET BY MOUTH EVERY DAY   hydrochlorothiazide  (HYDRODIURIL ) 25 MG tablet TAKE 1 TABLET (25 MG TOTAL) BY MOUTH DAILY.   HYDROcodone-acetaminophen  (NORCO) 10-325 MG tablet Take 0.5-1 tablets by mouth daily as needed.   Milk Thistle 175 MG CAPS Take 1 capsule by mouth in the morning and at bedtime.   Multiple Vitamins-Minerals (CENTRUM ADULTS) TABS Take by mouth.   omeprazole (PRILOSEC OTC) 20 MG tablet Take 20 mg by mouth daily.   potassium chloride  (KLOR-CON ) 10 MEQ tablet TAKE 1 TABLET BY MOUTH EVERY DAY   S-Adenosylmethionine (SAM-E PO) Take by mouth.   HYDROcodone-acetaminophen  (NORCO/VICODIN) 5-325 MG tablet  Take 0.5-1 tablets by mouth daily as needed. (Patient not taking: Reported on 08/15/2023)   No facility-administered encounter medications on file as of 08/15/2023.    Allergies (verified) Cymbalta [duloxetine hcl], Flulaval quadrivalent [influenza vac split quad], Pineapple, and Latex   History: Past Medical History:  Diagnosis Date   Arthritis    Bell's palsy    DDD (degenerative disc disease), lumbar     Essential hypertension    Fatigue    Fibromyalgia    GERD (gastroesophageal reflux disease)    Hemangioma of intra-abdominal structures    History of Lyme disease    Hyperlipidemia, mixed    PONV (postoperative nausea and vomiting)    Wears contact lenses    Right eye only   Past Surgical History:  Procedure Laterality Date   COLONOSCOPY     COLONOSCOPY WITH PROPOFOL  N/A 10/15/2020   Procedure: COLONOSCOPY WITH PROPOFOL ;  Surgeon: Marnee Sink, MD;  Location: [facility];  Service: Endoscopy;  Laterality: N/A;  Latex   TUBAL LIGATION     Family History  Problem Relation Age of Onset   Hypertension Mother    Osteoporosis Mother    Breast cancer Mother        ~35   Alcohol abuse Mother    Cancer Mother    Hypertension Father    Heart attack Father    Stroke Father    Heart disease Father    Kidney disease Maternal Grandmother    Osteoporosis Maternal Grandmother    Alcohol abuse Maternal Grandmother    Arthritis Maternal Grandmother    Kidney disease Maternal Grandfather    Cancer Maternal Grandfather    Kidney disease Paternal Grandmother    Cancer Paternal Grandmother    Kidney disease Paternal Grandfather    Breast cancer Paternal Aunt        3 pat aunts   Cancer Paternal Aunt    Cancer Paternal Aunt    Social History   Socioeconomic History   Marital status: Married    Spouse name: Polly Brink   Number of children: 0   Years of education: Not on file   Highest education level: Bachelor's degree (e.g., BA, AB, BS)  Occupational History   Not on file  Tobacco Use   Smoking status: Never    Passive exposure: Past   Smokeless tobacco: Never  Vaping Use   Vaping status: Never Used  Substance and Sexual Activity   Alcohol use: Yes    Comment: 1 glass of wine every 3-4 months   Drug use: No   Sexual activity: Not Currently    Birth control/protection: Post-menopausal  Other Topics Concern   Not on file  Social History Narrative   Not on file   Social  Drivers of Health   Financial Resource Strain: Low Risk  (08/15/2023)   Overall Financial Resource Strain (CARDIA)    Difficulty of Paying Living Expenses: Not hard at all  Food Insecurity: No Food Insecurity (08/15/2023)   Hunger Vital Sign    Worried About Running Out of Food in the Last Year: Never true    Ran Out of Food in the Last Year: Never true  Transportation Needs: No Transportation Needs (08/15/2023)   PRAPARE - Administrator, Civil Service (Medical): No    Lack of Transportation (Non-Medical): No  Physical Activity: Insufficiently Active (08/15/2023)   Exercise Vital Sign    Days of Exercise per Week: 2 days    Minutes of Exercise per Session: 10  min  Stress: Stress Concern Present (08/15/2023)   [employer] of Occupational Health - Occupational Stress Questionnaire    Feeling of Stress : To some extent  Social Connections: Moderately Isolated (08/15/2023)   Social Connection and Isolation Panel [NHANES]    Frequency of Communication with Friends and Family: More than three times a week    Frequency of Social Gatherings with Friends and Family: Three times a week    Attends Religious Services: Never    Active Member of Clubs or Organizations: No    Attends Engineer, structural: Never    Marital Status: Married    Tobacco Counseling Counseling given: No    Clinical Intake:  Pre-visit preparation completed: Yes  Pain : 0-10 Pain Score: 4  Pain Type: Chronic pain Pain Location: Knee Pain Orientation: Left, Right Pain Descriptors / Indicators: Aching     BMI - recorded: 33.11 Nutritional Status: BMI > 30  Obese Nutritional Risks: None Diabetes: No  No results found for: HGBA1C   How often do you need to have someone help you when you read instructions, pamphlets, or other written materials from your doctor or pharmacy?: 1 - Never  Interpreter Needed?: No  Information entered by :: Jaunita Messier, CMA   Activities of Daily  Living     08/15/2023    1:21 PM  In your present state of health, do you have any difficulty performing the following activities:  Hearing? 0  Vision? 0  Difficulty concentrating or making decisions? 0  Walking or climbing stairs? 1  Comment trouble with stiars, when knees are bothering her  Dressing or bathing? 0  Doing errands, shopping? 0  Preparing Food and eating ? N  Using the Toilet? N  In the past six months, have you accidently leaked urine? N  Do you have problems with loss of bowel control? N  Managing your Medications? N  Managing your Finances? N  Housekeeping or managing your Housekeeping? N    Patient Care Team: Sheron Dixons, MD as PCP - General (Internal Medicine) [place], [facility] [place]) Julie Oddi, DO as Referring Physician (Chiropractic Medicine) Hodges Lunch, NP as Nurse Practitioner (Pain Medicine)  I have updated your Care Teams any recent Medical Services you may have received from other providers in the past year.     Assessment:    This is a routine wellness examination for Jessica Mcgee.  Hearing/Vision screen Hearing Screening - Comments:: Denies hearing loss Vision Screening - Comments:: Gets routine eye exams, Dr. Caroleen Churn, [place] [facility] [place]   Goals Addressed             This Visit's Progress    Patient Stated       Lose 20 lbs       Depression Screen     08/15/2023    1:29 PM 01/15/2023    2:52 PM 08/09/2022    2:35 PM 08/02/2022    9:54 AM 03/01/2022    1:28 PM 01/30/2022   10:51 AM 10/28/2021    9:03 AM  PHQ 2/9 Scores  PHQ - 2 Score 2 4 2 4 2 2 4   PHQ- 9 Score 4 9 4 15 3 8 10     Fall Risk     08/15/2023    1:38 PM 01/15/2023    2:52 PM 08/09/2022    2:38 PM 08/05/2022   11:07 AM 08/02/2022    9:54 AM  Fall Risk   Falls in the  past year? 1 0 0 0 0  Number falls in past yr: 0 0 0  0  Injury with Fall? 0 0 0  0  Risk for fall due to : Impaired balance/gait;History of fall(s) No Fall Risks  No Fall Risks  No Fall Risks  Follow up Falls evaluation completed;Education provided Falls evaluation completed Falls prevention discussed;Falls evaluation completed  Falls evaluation completed    MEDICARE RISK AT HOME:  Medicare Risk at Home Any stairs in or around the home?: Yes If so, are there any without handrails?: No Home free of loose throw rugs in walkways, pet beds, electrical cords, etc?: Yes Adequate lighting in your home to reduce risk of falls?: Yes Life alert?: No Use of a cane, walker or w/c?: Yes (uses cane as needed) Grab bars in the bathroom?: Yes Shower chair or bench in shower?: Yes Elevated toilet seat or a handicapped toilet?: No  TIMED UP AND GO:  Was the test performed?  No  Cognitive Function: 6CIT completed        08/15/2023    1:40 PM 08/09/2022    2:43 PM  6CIT Screen  What Year? 0 points 0 points  What month? 0 points 0 points  What time? 0 points 0 points  Count back from 20 0 points 0 points  Months in reverse 0 points 0 points  Repeat phrase 0 points 0 points  Total Score 0 points 0 points    Immunizations Immunization History  Administered Date(s) Administered   PFIZER(Purple Top)SARS-COV-2 Vaccination 06/06/2019, 07/18/2019   Td 01/28/2014    Screening Tests Health Maintenance  Topic Date Due   DEXA SCAN  11/05/2023   MAMMOGRAM  01/03/2024   DTaP/Tdap/Td (2 - Tdap) 01/29/2024   Medicare Annual Wellness (AWV)  08/14/2024   Colonoscopy  10/16/2030   Hepatitis C Screening  Completed   HPV VACCINES  Aged Out   Meningococcal B Vaccine  Aged Out   Pneumonia Vaccine 29+ Years old  Discontinued   COVID-19 Vaccine  Discontinued   Zoster Vaccines- Shingrix  Discontinued    Health Maintenance  There are no preventive care reminders to display for this patient.  Health Maintenance Items Addressed: Mammogram ordered, DEXA ordered, See Nurse Notes at the end of this note  Additional Screening:  Vision Screening: Recommended  annual ophthalmology exams for early detection of glaucoma and other disorders of the eye. Would you like a referral to an eye doctor? No    Dental Screening: Recommended annual dental exams for proper oral hygiene  Community Resource Referral / Chronic Care Management: CRR required this visit?  No   CCM required this visit?  No   Plan:    I have personally reviewed and noted the following in the patient's chart:   Medical and social history Use of alcohol, tobacco or illicit drugs  Current medications and supplements including opioid prescriptions. Patient is currently taking opioid prescriptions. Information provided to patient regarding non-opioid alternatives. Patient advised to discuss non-opioid treatment plan with their provider. Functional ability and status Nutritional status Physical activity Advanced directives List of other physicians Hospitalizations, surgeries, and ER visits in previous 12 months Vitals Screenings to include cognitive, depression, and falls Referrals and appointments  In addition, I have reviewed and discussed with patient certain preventive protocols, quality metrics, and best practice recommendations. A written personalized care plan for preventive services as well as general preventive health recommendations were provided to patient.   Jaunita Messier, [facility]   08/15/2023  After Visit Summary: (MyChart) Due to this being a telephonic visit, the after visit summary with patients personalized plan was offered to patient via MyChart   Notes:  Placed order for MMG (due ~01/03/24) and DEXA scan (due ~11/05/23) Declined Pneumonia Covid and Shingles vaccines Allergic to Flu

## 2023-08-15 NOTE — Patient Instructions (Signed)
 [name] , Thank you for taking time out of your busy schedule to complete your Annual Wellness Visit with me. I enjoyed our conversation and look forward to speaking with you again next year. I, as well as your care team,  appreciate your ongoing commitment to your health goals. Please review the following plan we discussed and let me know if I can assist you in the future. Your Game plan/ To Do List    Referrals: None  Follow up Visits: Next Medicare AWV with our clinical staff: 08/20/24 @ 1:20pm (PHONE VISIT)   Have you seen your provider in the last 6 months (3 months if uncontrolled diabetes)? No Next Office Visit with your provider: 09/17/23 @ 10am with Dr. Barnetta Liberty  Clinician Recommendations: I have placed an order for a mammogram and bone density scan (due 01/04/24), please call [phone] to schedule at your earliest convenience.  Aim for 30 minutes of exercise or brisk walking, 6-8 glasses of water , and 5 servings of fruits and vegetables each day.       This is a list of the screening recommended for you and due dates:  Health Maintenance  Topic Date Due   DEXA scan (bone density measurement)  11/05/2023   Mammogram  01/03/2024   DTaP/Tdap/Td vaccine (2 - Tdap) 01/29/2024   Medicare Annual Wellness Visit  08/14/2024   Colon Cancer Screening  10/16/2030   Hepatitis C Screening  Completed   HPV Vaccine  Aged Out   Meningitis B Vaccine  Aged Out   Pneumonia Vaccine  Discontinued   COVID-19 Vaccine  Discontinued   Zoster (Shingles) Vaccine  Discontinued    Advanced directives: (Copy Requested) Please bring a copy of your health care power of attorney and living will to the office to be added to your chart at your convenience. You can mail to [facility] [address] [place], [place] [address] or email to [email or web address] Advance Care Planning is important because it:  [x]  Makes sure you receive the medical care that is consistent with your  values, goals, and preferences  [x]  It provides guidance to your family and loved ones and reduces their decisional burden about whether or not they are making the right decisions based on your wishes.  Follow the link provided in your after visit summary or read over the paperwork we have mailed to you to help you started getting your Advance Directives in place. If you need assistance in completing these, please reach out to us  so that we can help you!  See attachments for Preventive Care and Fall Prevention Tips.   Fall Prevention in the Home, Adult Falls can cause injuries and affect people of all ages. There are many simple things that you can do to make your home safe and to help prevent falls. If you need it, ask for help making these changes. What actions can I take to prevent falls? General information Use good lighting in all rooms. Make sure to: Replace any light bulbs that burn out. Turn on lights if it is dark and use night-lights. Keep items that you use often in easy-to-reach places. Lower the shelves around your home if needed. Move furniture so that there are clear paths around it. Do not keep throw rugs or other things on the floor that can make you trip. If any of your floors are uneven, fix them. Add color or contrast paint or tape to clearly mark and help you see: Grab bars or  handrails. First and last steps of staircases. Where the edge of each step is. If you use a ladder or stepladder: Make sure that it is fully opened. Do not climb a closed ladder. Make sure the sides of the ladder are locked in place. Have someone hold the ladder while you use it. Know where your pets are as you move through your home. What can I do in the bathroom?     Keep the floor dry. Clean up any water  that is on the floor right away. Remove soap buildup in the bathtub or shower. Buildup makes bathtubs and showers slippery. Use non-skid mats or decals on the floor of the bathtub or  shower. Attach bath mats securely with double-sided, non-slip rug tape. If you need to sit down while you are in the shower, use a non-slip stool. Install grab bars by the toilet and in the bathtub and shower. Do not use towel bars as grab bars. What can I do in the bedroom? Make sure that you have a light by your bed that is easy to reach. Do not use any sheets or blankets on your bed that hang to the floor. Have a firm bench or chair with side arms that you can use for support when you get dressed. What can I do in the kitchen? Clean up any spills right away. If you need to reach something above you, use a sturdy step stool that has a grab bar. Keep electrical cables out of the way. Do not use floor polish or wax that makes floors slippery. What can I do with my stairs? Do not leave anything on the stairs. Make sure that you have a light switch at the top and the bottom of the stairs. Have them installed if you do not have them. Make sure that there are handrails on both sides of the stairs. Fix handrails that are broken or loose. Make sure that handrails are as long as the staircases. Install non-slip stair treads on all stairs in your home if they do not have carpet. Avoid having throw rugs at the top or bottom of stairs, or secure the rugs with carpet tape to prevent them from moving. Choose a carpet design that does not hide the edge of steps on the stairs. Make sure that carpet is firmly attached to the stairs. Fix any carpet that is loose or worn. What can I do on the outside of my home? Use bright outdoor lighting. Repair the edges of walkways and driveways and fix any cracks. Clear paths of anything that can make you trip, such as tools or rocks. Add color or contrast paint or tape to clearly mark and help you see high doorway thresholds. Trim any bushes or trees on the main path into your home. Check that handrails are securely fastened and in good repair. Both sides of all steps  should have handrails. Install guardrails along the edges of any raised decks or porches. Have leaves, snow, and ice cleared regularly. Use sand, salt, or ice melt on walkways during winter months if you live where there is ice and snow. In the garage, clean up any spills right away, including grease or oil spills. What other actions can I take? Review your medicines with your health care provider. Some medicines can make you confused or feel dizzy. This can increase your chance of falling. Wear closed-toe shoes that fit well and support your feet. Wear shoes that have rubber soles and low heels. Use a  cane, walker, scooter, or crutches that help you move around if needed. Talk with your provider about other ways that you can decrease your risk of falls. This may include seeing a physical therapist to learn to do exercises to improve movement and strength. Where to find more information Centers for Disease Control and Prevention, STEAD[employer]: TonerPromos.no [employer] on Aging: BaseRingTones.pl National Institute on Aging: BaseRingTones.pl Contact a health care provider if: You are afraid of falling at home. You feel weak, drowsy, or dizzy at home. You fall at home. Get help right away if you: Lose consciousness or have trouble moving after a fall. Have a fall that causes a head injury. These symptoms may be an emergency. Get help right away. Call 911. Do not wait to see if the symptoms will go away. Do not drive yourself to the hospital. This information is not intended to replace advice given to you by your health care provider. Make sure you discuss any questions you have with your health care provider. Document Revised: 10/24/2021 Document Reviewed: 10/24/2021 Elsevier Patient Education  2024 Elsevier Inc.   Managing Pain Without Opioids Opioids are strong medicines used to treat moderate to severe pain. For some people, especially those who have long-term (chronic) pain, opioids may not be the  best choice for pain management due to: Side effects like nausea, constipation, and sleepiness. The risk of addiction (opioid use disorder). The longer you take opioids, the greater your risk of addiction. Pain that lasts for more than 3 months is called chronic pain. Managing chronic pain usually requires more than one approach and is often provided by a team of health care providers working together (multidisciplinary approach). Pain management may be done at a pain management center or pain clinic. How to manage pain without the use of opioids Use non-opioid medicines Non-opioid medicines for pain may include: Over-the-counter or prescription non-steroidal anti-inflammatory drugs (NSAIDs). These may be the first medicines used for pain. They work well for muscle and bone pain, and they reduce swelling. Acetaminophen . This over-the-counter medicine may work well for milder pain but not swelling. Antidepressants. These may be used to treat chronic pain. A certain type of antidepressant (tricyclics) is often used. These medicines are given in lower doses for pain than when used for depression. Anticonvulsants. These are usually used to treat seizures but may also reduce nerve (neuropathic) pain. Muscle relaxants. These relieve pain caused by sudden muscle tightening (spasms). You may also use a pain medicine that is applied to the skin as a patch, cream, or gel (topical analgesic), such as a numbing medicine. These may cause fewer side effects than medicines taken by mouth. Do certain therapies as directed Some therapies can help with pain management. They include: Physical therapy. You will do exercises to gain strength and flexibility. A physical therapist may teach you exercises to move and stretch parts of your body that are weak, stiff, or painful. You can learn these exercises at physical therapy visits and practice them at home. Physical therapy may also involve: Massage. Heat wraps or  applying heat or cold to affected areas. Electrical signals that interrupt pain signals (transcutaneous electrical nerve stimulation, TENS). Weak lasers that reduce pain and swelling (low-level laser therapy). Signals from your body that help you learn to regulate pain (biofeedback). Occupational therapy. This helps you to learn ways to function at home and work with less pain. Recreational therapy. This involves trying new activities or hobbies, such as a physical activity or drawing. Mental health  therapy, including: Cognitive behavioral therapy (CBT). This helps you learn coping skills for dealing with pain. Acceptance and commitment therapy (ACT) to change the way you think and react to pain. Relaxation therapies, including muscle relaxation exercises and mindfulness-based stress reduction. Pain management counseling. This may be individual, family, or group counseling.  Receive medical treatments Medical treatments for pain management include: Nerve block injections. These may include a pain blocker and anti-inflammatory medicines. You may have injections: Near the spine to relieve chronic back or neck pain. Into joints to relieve back or joint pain. Into nerve areas that supply a painful area to relieve body pain. Into muscles (trigger point injections) to relieve some painful muscle conditions. A medical device placed near your spine to help block pain signals and relieve nerve pain or chronic back pain (spinal cord stimulation device). Acupuncture. Follow these instructions at home Medicines Take over-the-counter and prescription medicines only as told by your health care provider. If you are taking pain medicine, ask your health care providers about possible side effects to watch out for. Do not drive or use heavy machinery while taking prescription opioid pain medicine. Lifestyle  Do not use drugs or alcohol to reduce pain. If you drink alcohol, limit how much you have to: 0-1  drink a day for women who are not pregnant. 0-2 drinks a day for men. Know how much alcohol is in a drink. In the [place] one drink equals one 12 oz bottle of beer (355 mL), one 5 oz glass of wine (148 mL), or one 1 oz glass of hard liquor (44 mL). Do not use any products that contain nicotine or tobacco. These products include cigarettes, chewing tobacco, and vaping devices, such as e-cigarettes. If you need help quitting, ask your health care provider. Eat a healthy diet and maintain a healthy weight. Poor diet and excess weight may make pain worse. Eat foods that are high in fiber. These include fresh fruits and vegetables, whole grains, and beans. Limit foods that are high in fat and processed sugars, such as fried and sweet foods. Exercise regularly. Exercise lowers stress and may help relieve pain. Ask your health care provider what activities and exercises are safe for you. If your health care provider approves, join an exercise class that combines movement and stress reduction. Examples include yoga and tai chi. Get enough sleep. Lack of sleep may make pain worse. Lower stress as much as possible. Practice stress reduction techniques as told by your therapist. General instructions Work with all your pain management providers to find the treatments that work best for you. You are an important member of your pain management team. There are many things you can do to reduce pain on your own. Consider joining an online or in-person support group for people who have chronic pain. Keep all follow-up visits. This is important. Where to find more information You can find more information about managing pain without opioids from: American Academy of Pain Medicine: painmed.org Institute for Chronic Pain: instituteforchronicpain.org American Chronic Pain Association: theacpa.org Contact a health care provider if: You have side effects from pain medicine. Your pain gets worse or does not get better  with treatments or home therapy. You are struggling with anxiety or depression. Summary Many types of pain can be managed without opioids. Chronic pain may respond better to pain management without opioids. Pain is best managed when you and a team of health care providers work together. Pain management without opioids may include non-opioid medicines, medical treatments,  physical therapy, mental health therapy, and lifestyle changes. Tell your health care providers if your pain gets worse or is not being managed well enough. This information is not intended to replace advice given to you by your health care provider. Make sure you discuss any questions you have with your health care provider. Document Revised: 06/02/2020 Document Reviewed: 06/02/2020 Elsevier Patient Education  2024 [employer].

## 2023-08-18 ENCOUNTER — Other Ambulatory Visit: Payer: Self-pay | Admitting: Internal Medicine

## 2023-08-18 DIAGNOSIS — F411 Generalized anxiety disorder: Secondary | ICD-10-CM

## 2023-08-20 NOTE — Telephone Encounter (Signed)
 Courtesy refill. Patient will need an office visit for additional refills.  Requested Prescriptions  Pending Prescriptions Disp Refills   escitalopram  (LEXAPRO ) 10 MG tablet [Pharmacy Med Name: ESCITALOPRAM  10 MG TABLET] 30 tablet 0    Sig: TAKE 1 TABLET BY MOUTH EVERY DAY     Psychiatry:  Antidepressants - SSRI Failed - 08/20/2023  5:27 PM      Failed - Valid encounter within last 6 months    Recent Outpatient Visits   None

## 2023-08-31 DIAGNOSIS — M170 Bilateral primary osteoarthritis of knee: Secondary | ICD-10-CM | POA: Diagnosis not present

## 2023-08-31 DIAGNOSIS — Z79899 Other long term (current) drug therapy: Secondary | ICD-10-CM | POA: Diagnosis not present

## 2023-08-31 DIAGNOSIS — G894 Chronic pain syndrome: Secondary | ICD-10-CM | POA: Diagnosis not present

## 2023-09-13 ENCOUNTER — Other Ambulatory Visit: Payer: Self-pay | Admitting: Internal Medicine

## 2023-09-13 DIAGNOSIS — F411 Generalized anxiety disorder: Secondary | ICD-10-CM

## 2023-09-14 NOTE — Telephone Encounter (Signed)
 Courtesy refill. Patient will need an office visit for additional refills.  Requested Prescriptions  Pending Prescriptions Disp Refills   escitalopram  (LEXAPRO ) 10 MG tablet [Pharmacy Med Name: ESCITALOPRAM  10 MG TABLET] 30 tablet 0    Sig: TAKE 1 TABLET BY MOUTH EVERY DAY     Psychiatry:  Antidepressants - SSRI Passed - 09/14/2023  3:27 PM      Passed - Valid encounter within last 6 months    Recent Outpatient Visits   None

## 2023-09-16 ENCOUNTER — Other Ambulatory Visit: Payer: Self-pay | Admitting: Internal Medicine

## 2023-09-16 DIAGNOSIS — I10 Essential (primary) hypertension: Secondary | ICD-10-CM

## 2023-09-17 ENCOUNTER — Encounter: Admitting: Student

## 2023-09-18 NOTE — Telephone Encounter (Signed)
 Requested Prescriptions  Pending Prescriptions Disp Refills   hydrochlorothiazide  (HYDRODIURIL ) 25 MG tablet [Pharmacy Med Name: HYDROCHLOROTHIAZIDE  25 MG TAB] 90 tablet 0    Sig: TAKE 1 TABLET (25 MG TOTAL) BY MOUTH DAILY.     Cardiovascular: Diuretics - Thiazide Failed - 09/18/2023  9:27 AM      Failed - Cr in normal range and within 180 days    Creatinine, Ser  Date Value Ref Range Status  08/02/2022 0.58 0.57 - 1.00 mg/dL Final         Failed - K in normal range and within 180 days    Potassium  Date Value Ref Range Status  08/02/2022 4.0 3.5 - 5.2 mmol/L Final         Failed - Na in normal range and within 180 days    Sodium  Date Value Ref Range Status  08/02/2022 140 134 - 144 mmol/L Final         Passed - Last BP in normal range    BP Readings from Last 1 Encounters:  01/15/23 126/78         Passed - Valid encounter within last 6 months    Recent Outpatient Visits   None

## 2023-09-20 ENCOUNTER — Ambulatory Visit (INDEPENDENT_AMBULATORY_CARE_PROVIDER_SITE_OTHER): Admitting: Internal Medicine

## 2023-09-20 ENCOUNTER — Encounter: Payer: Self-pay | Admitting: Internal Medicine

## 2023-09-20 VITALS — BP 120/76 | HR 76 | Ht 62.0 in | Wt 189.0 lb

## 2023-09-20 DIAGNOSIS — R400 Somnolence: Secondary | ICD-10-CM | POA: Diagnosis not present

## 2023-09-20 DIAGNOSIS — K219 Gastro-esophageal reflux disease without esophagitis: Secondary | ICD-10-CM | POA: Diagnosis not present

## 2023-09-20 DIAGNOSIS — I10 Essential (primary) hypertension: Secondary | ICD-10-CM

## 2023-09-20 DIAGNOSIS — E782 Mixed hyperlipidemia: Secondary | ICD-10-CM

## 2023-09-20 DIAGNOSIS — F411 Generalized anxiety disorder: Secondary | ICD-10-CM | POA: Diagnosis not present

## 2023-09-20 DIAGNOSIS — Z1211 Encounter for screening for malignant neoplasm of colon: Secondary | ICD-10-CM

## 2023-09-20 DIAGNOSIS — Z1231 Encounter for screening mammogram for malignant neoplasm of breast: Secondary | ICD-10-CM | POA: Diagnosis not present

## 2023-09-20 DIAGNOSIS — Z0000 Encounter for general adult medical examination without abnormal findings: Secondary | ICD-10-CM

## 2023-09-20 NOTE — Assessment & Plan Note (Signed)
Managed with diet only.

## 2023-09-20 NOTE — Progress Notes (Signed)
 Date:  09/20/2023   Name:  Gussie [facility]ton [facility]   DOB:  29-Mar-1950   MRN:  [id number]   Chief Complaint: Annual Exam Jessica Mcgee is a 73 y.o. female who presents today for her Complete Annual Exam. She feels fatigued. She reports exercising- some. She reports she is sleeping fairly well. Breast complaints - none.  Health Maintenance  Topic Date Due   DEXA scan (bone density measurement)  11/05/2023   Mammogram  01/03/2024   DTaP/Tdap/Td vaccine (2 - Tdap) 01/29/2024   Medicare Annual Wellness Visit  08/14/2024   Colon Cancer Screening  10/16/2030   Hepatitis C Screening  Completed   Hepatitis B Vaccine  Aged Out   HPV Vaccine  Aged Out   Meningitis B Vaccine  Aged Out   Pneumococcal Vaccine for age over 4  Discontinued   COVID-19 Vaccine  Discontinued   Zoster (Shingles) Vaccine  Discontinued    Hypertension This is a chronic problem. The problem is controlled. Associated symptoms include anxiety. Pertinent negatives include no chest pain, headaches, palpitations or shortness of breath. Past treatments include diuretics. The current treatment provides significant improvement.  Anxiety Presents for follow-up visit. Patient reports no chest pain, dizziness, nervous/anxious behavior, palpitations or shortness of breath.    Gastroesophageal Reflux She complains of heartburn. She reports no abdominal pain, no chest pain, no coughing or no wheezing. This is a recurrent problem. The problem occurs occasionally. Associated symptoms include fatigue. She has tried a PPI for the symptoms.    Review of Systems  Constitutional:  Positive for fatigue. Negative for unexpected weight change.  HENT:  Negative for trouble swallowing.   Eyes:  Negative for visual disturbance.  Respiratory:  Negative for cough, chest tightness, shortness of breath and wheezing.   Cardiovascular:  Negative for chest pain, palpitations and leg swelling.  Gastrointestinal:  Positive for heartburn. Negative for  abdominal pain, constipation and diarrhea.  Genitourinary:  Negative for dysuria and urgency.  Musculoskeletal:  Negative for arthralgias and myalgias.  Skin:  Negative for color change and rash.  Neurological:  Negative for dizziness, weakness, light-headedness and headaches.  Psychiatric/Behavioral:  Positive for dysphoric mood and sleep disturbance. The patient is not nervous/anxious.      Lab Results  Component Value Date   NA 140 08/02/2022   K 4.0 08/02/2022   CO2 25 08/02/2022   GLUCOSE 87 08/02/2022   BUN 14 08/02/2022   CREATININE 0.58 08/02/2022   CALCIUM 9.6 08/02/2022   EGFR 98 08/02/2022   GFRNONAA 77 05/07/2019   Lab Results  Component Value Date   CHOL 166 08/02/2022   HDL 53 08/02/2022   LDLCALC 98 08/02/2022   TRIG 82 08/02/2022   CHOLHDL 3.1 08/02/2022   Lab Results  Component Value Date   TSH 2.110 08/02/2022   No results found for: HGBA1C Lab Results  Component Value Date   WBC 7.6 08/02/2022   HGB 12.5 08/02/2022   HCT 37.7 08/02/2022   MCV 87 08/02/2022   PLT 411 08/02/2022   Lab Results  Component Value Date   ALT 16 08/02/2022   AST 14 08/02/2022   ALKPHOS 62 08/02/2022   BILITOT 0.2 08/02/2022   Lab Results  Component Value Date   VD25OH 41.9 08/25/2014     Patient Active Problem List   Diagnosis Date Noted   Generalized anxiety disorder 01/30/2022   Trigger middle finger of left hand 01/20/2021   Primary osteoarthritis of both hands 01/20/2021  Hyperlipidemia, mixed 05/08/2019   History of Lyme disease 03/31/2019   Osteoarthritis of both knees 07/04/2017   Hemangioma of intra-abdominal structures 04/19/2016   GERD (gastroesophageal reflux disease) 10/07/2015   Essential hypertension 02/10/2015   Fibromyalgia 08/25/2014    Allergies  Allergen Reactions   Cymbalta [Duloxetine Hcl]     Marked fatigue   Flulaval Quadrivalent [Influenza Vac Split Quad]     Unable to take due to hx of lime disease and bell's palsy    Pineapple Swelling    Tongue swelling, mouth ulcers   Latex Rash    (With extended contact)    Past Surgical History:  Procedure Laterality Date   COLONOSCOPY     COLONOSCOPY WITH PROPOFOL  N/A 10/15/2020   Procedure: COLONOSCOPY WITH PROPOFOL ;  Surgeon: Jinny Carmine, MD;  Location: [facility];  Service: Endoscopy;  Laterality: N/A;  Latex   TUBAL LIGATION      Social History   Tobacco Use   Smoking status: Never    Passive exposure: Past   Smokeless tobacco: Never  Vaping Use   Vaping status: Never Used  Substance Use Topics   Alcohol use: Yes    Comment: 1 glass of wine every 3-4 months   Drug use: No     Medication list has been reviewed and updated.  Current Meds  Medication Sig   Cholecalciferol (VITAMIN D -1000 MAX ST) 25 MCG (1000 UT) tablet Take 1,000 Units by mouth daily.   escitalopram  (LEXAPRO ) 10 MG tablet TAKE 1 TABLET BY MOUTH EVERY DAY   hydrochlorothiazide  (HYDRODIURIL ) 25 MG tablet TAKE 1 TABLET (25 MG TOTAL) BY MOUTH DAILY.   HYDROcodone-acetaminophen  (NORCO) 10-325 MG tablet Take 0.5-1 tablets by mouth daily as needed.   Milk Thistle 175 MG CAPS Take 1 capsule by mouth in the morning and at bedtime.   Multiple Vitamins-Minerals (CENTRUM ADULTS) TABS Take by mouth.   omeprazole (PRILOSEC OTC) 20 MG tablet Take 20 mg by mouth daily.   potassium chloride  (KLOR-CON ) 10 MEQ tablet TAKE 1 TABLET BY MOUTH EVERY DAY   S-Adenosylmethionine (SAM-E PO) Take by mouth.       09/20/2023    9:55 AM 01/15/2023    2:52 PM 08/02/2022    9:54 AM 03/01/2022    1:28 PM  GAD 7 : Generalized Anxiety Score  Nervous, Anxious, on Edge 1 3 0 1  Control/stop worrying 1 3 0 1  Worry too much - different things 1 3 0 1  Trouble relaxing 0 3 0 1  Restless 0 3 0 1  Easily annoyed or irritable 0 3 0 1  Afraid - awful might happen 0 3 0 2  Total GAD 7 Score 3 21 0 8  Anxiety Difficulty Somewhat difficult Extremely difficult Not difficult at all Somewhat difficult        09/20/2023    9:55 AM 08/15/2023    1:29 PM 01/15/2023    2:52 PM  Depression screen PHQ 2/9  Decreased Interest 3 1 2   Down, Depressed, Hopeless 3 1 2   PHQ - 2 Score 6 2 4   Altered sleeping 3 1 1   Tired, decreased energy 3 1 1   Change in appetite 0 0 3  Feeling bad or failure about yourself  0 0 0  Trouble concentrating 0 0 0  Moving slowly or fidgety/restless 0 0 0  Suicidal thoughts 0 0 0  PHQ-9 Score 12 4 9   Difficult doing work/chores Not difficult at all Somewhat difficult Not difficult at all  09/20/2023   10:07 AM  Results of the Epworth flowsheet  Sitting and reading 1  Watching TV 0  Sitting, inactive in a public place (e.g. a theatre or a meeting) 0  As a passenger in a car for an hour without a break 1  Lying down to rest in the afternoon when circumstances permit 3  Sitting and talking to someone 0  Sitting quietly after a lunch without alcohol 1  In a car, while stopped for a few minutes in traffic 0  Total score 6     BP Readings from Last 3 Encounters:  09/20/23 120/76  01/15/23 126/78  08/02/22 129/76    Physical Exam Vitals and nursing note reviewed.  Constitutional:      General: She is not in acute distress.    Appearance: Normal appearance. She is well-developed.  HENT:     Head: Normocephalic and atraumatic.     Right Ear: Tympanic membrane and ear canal normal.     Left Ear: Tympanic membrane and ear canal normal.     Nose:     Right Sinus: No maxillary sinus tenderness.     Left Sinus: No maxillary sinus tenderness.  Eyes:     General: No scleral icterus.       Right eye: No discharge.        Left eye: No discharge.     Conjunctiva/sclera: Conjunctivae normal.  Neck:     Thyroid : No thyromegaly.     Vascular: No carotid bruit.  Cardiovascular:     Rate and Rhythm: Normal rate and regular rhythm.     Pulses: Normal pulses.     Heart sounds: Normal heart sounds.  Pulmonary:     Effort: Pulmonary effort is normal. No  respiratory distress.     Breath sounds: No wheezing.  Abdominal:     General: Bowel sounds are normal.     Palpations: Abdomen is soft.     Tenderness: There is no abdominal tenderness.  Musculoskeletal:     Cervical back: Normal range of motion. No erythema.     Right lower leg: No edema.     Left lower leg: No edema.  Lymphadenopathy:     Cervical: No cervical adenopathy.  Skin:    General: Skin is warm and dry.     Capillary Refill: Capillary refill takes less than 2 seconds.     Findings: No rash.  Neurological:     General: No focal deficit present.     Mental Status: She is alert and oriented to person, place, and time.     Cranial Nerves: No cranial nerve deficit.     Sensory: No sensory deficit.     Deep Tendon Reflexes: Reflexes are normal and symmetric.  Psychiatric:        Attention and Perception: Attention normal.        Mood and Affect: Mood normal.     Wt Readings from Last 3 Encounters:  09/20/23 189 lb (85.7 kg)  08/15/23 181 lb (82.1 kg)  01/15/23 187 lb (84.8 kg)    BP 120/76   Pulse 76   Ht 5' 2 (1.575 m)   Wt 189 lb (85.7 kg)   SpO2 97%   BMI 34.57 kg/m   Assessment and Plan:  Problem List Items Addressed This Visit       Unprioritized   Essential hypertension (Chronic)   Blood pressure is well controlled.  Current medications hydrochlorothiazide . Will continue same regimen along with efforts to  limit dietary sodium.       Relevant Orders   CBC with Differential/Platelet   Comprehensive metabolic panel with GFR   TSH   Urinalysis, Routine w reflex microscopic   GERD (gastroesophageal reflux disease) (Chronic)   Reflux symptoms are controlled on PPI. Patient denies red flag symptoms - no melena, weight loss, dysphagia.       Relevant Orders   CBC with Differential/Platelet   Hyperlipidemia, mixed (Chronic)   Managed with diet only.      Relevant Orders   Lipid panel   Generalized anxiety disorder (Chronic)   Clinically  stable on Lexapro  10 mg.   No SI or HI on evaluation. PHQ higher - mostly due to poor sleep, fatigue Plan to continue same medications for now.       Relevant Orders   TSH   Other Visit Diagnoses       Annual physical exam    -  Primary   She will begin daily oral B12 increase exercise as able     Encounter for screening mammogram for breast cancer       done 12/2022  BiRads0-4     Colon cancer screening       done 10/2020 repeat 10 yrs     Daytime somnolence       will order home sleep study to evaluate       No follow-ups on file.    Jessica HILARIO Adie, MD [facility] [place]

## 2023-09-20 NOTE — Patient Instructions (Addendum)
 Call [facility] to schedule your mammogram and Bone density at [phone].   [facility] - will contact you to get the home sleep study

## 2023-09-20 NOTE — Assessment & Plan Note (Signed)
 Blood pressure is well controlled.  Current medications hydrochlorothiazide. Will continue same regimen along with efforts to limit dietary sodium.

## 2023-09-20 NOTE — Assessment & Plan Note (Signed)
 Clinically stable on Lexapro  10 mg.   No SI or HI on evaluation. PHQ higher - mostly due to poor sleep, fatigue Plan to continue same medications for now.

## 2023-09-20 NOTE — Assessment & Plan Note (Signed)
 Reflux symptoms are controlled on PPI. Patient denies red flag symptoms - no melena, weight loss, dysphagia.

## 2023-09-21 ENCOUNTER — Ambulatory Visit: Payer: Self-pay | Admitting: Internal Medicine

## 2023-09-21 LAB — CBC WITH DIFFERENTIAL/PLATELET
Basophils Absolute: 0 x10E3/uL (ref 0.0–0.2)
Basos: 1 %
EOS (ABSOLUTE): 0.1 x10E3/uL (ref 0.0–0.4)
Eos: 1 %
Hematocrit: 40.2 % (ref 34.0–46.6)
Hemoglobin: 13.1 g/dL (ref 11.1–15.9)
Immature Grans (Abs): 0 x10E3/uL (ref 0.0–0.1)
Immature Granulocytes: 0 %
Lymphocytes Absolute: 2 x10E3/uL (ref 0.7–3.1)
Lymphs: 33 %
MCH: 29.7 pg (ref 26.6–33.0)
MCHC: 32.6 g/dL (ref 31.5–35.7)
MCV: 91 fL (ref 79–97)
Monocytes Absolute: 0.5 x10E3/uL (ref 0.1–0.9)
Monocytes: 9 %
Neutrophils Absolute: 3.5 x10E3/uL (ref 1.4–7.0)
Neutrophils: 56 %
Platelets: 263 x10E3/uL (ref 150–450)
RBC: 4.41 x10E6/uL (ref 3.77–5.28)
RDW: 12.9 % (ref 11.7–15.4)
WBC: 6.2 x10E3/uL (ref 3.4–10.8)

## 2023-09-21 LAB — LIPID PANEL
Chol/HDL Ratio: 2.4 ratio (ref 0.0–4.4)
Cholesterol, Total: 185 mg/dL (ref 100–199)
HDL: 77 mg/dL (ref >39)
LDL Chol Calc (NIH): 95 mg/dL (ref 0–99)
Triglycerides: 71 mg/dL (ref 0–149)
VLDL Cholesterol Cal: 13 mg/dL (ref 5–40)

## 2023-09-21 LAB — COMPREHENSIVE METABOLIC PANEL WITH GFR
ALT: 17 IU/L (ref 0–32)
AST: 18 IU/L (ref 0–40)
Albumin: 4.1 g/dL (ref 3.8–4.8)
Alkaline Phosphatase: 42 IU/L — ABNORMAL LOW (ref 44–121)
BUN/Creatinine Ratio: 25 (ref 12–28)
BUN: 14 mg/dL (ref 8–27)
Bilirubin Total: 0.4 mg/dL (ref 0.0–1.2)
CO2: 24 mmol/L (ref 20–29)
Calcium: 9.7 mg/dL (ref 8.7–10.3)
Chloride: 99 mmol/L (ref 96–106)
Creatinine, Ser: 0.56 mg/dL — ABNORMAL LOW (ref 0.57–1.00)
Globulin, Total: 2.3 g/dL (ref 1.5–4.5)
Glucose: 94 mg/dL (ref 70–99)
Potassium: 3.7 mmol/L (ref 3.5–5.2)
Sodium: 138 mmol/L (ref 134–144)
Total Protein: 6.4 g/dL (ref 6.0–8.5)
eGFR: 98 mL/min/1.73 (ref >59)

## 2023-09-21 LAB — URINALYSIS, ROUTINE W REFLEX MICROSCOPIC
Bilirubin, UA: NEGATIVE
Glucose, UA: NEGATIVE
Ketones, UA: NEGATIVE
Leukocytes,UA: NEGATIVE
Nitrite, UA: NEGATIVE
Protein,UA: NEGATIVE
RBC, UA: NEGATIVE
Specific Gravity, UA: 1.017 (ref 1.005–1.030)
Urobilinogen, Ur: 0.2 mg/dL (ref 0.2–1.0)
pH, UA: 7.5 (ref 5.0–7.5)

## 2023-09-21 LAB — TSH: TSH: 3.09 u[IU]/mL (ref 0.450–4.500)

## 2023-09-28 ENCOUNTER — Other Ambulatory Visit: Payer: Self-pay | Admitting: Internal Medicine

## 2023-09-28 DIAGNOSIS — I10 Essential (primary) hypertension: Secondary | ICD-10-CM

## 2023-09-28 NOTE — Telephone Encounter (Signed)
 Requested Prescriptions  Pending Prescriptions Disp Refills   potassium chloride  (KLOR-CON ) 10 MEQ tablet [Pharmacy Med Name: POTASSIUM CL ER 10 MEQ TAB WAX] 90 tablet 3    Sig: TAKE 1 TABLET BY MOUTH EVERY DAY     Endocrinology:  Minerals - Potassium Supplementation Failed - 09/28/2023  5:37 PM      Failed - Cr in normal range and within 360 days    Creatinine, Ser  Date Value Ref Range Status  09/20/2023 0.56 (L) 0.57 - 1.00 mg/dL Final         Passed - K in normal range and within 360 days    Potassium  Date Value Ref Range Status  09/20/2023 3.7 3.5 - 5.2 mmol/L Final         Passed - Valid encounter within last 12 months    Recent Outpatient Visits           1 week ago Annual physical exam   [facility] at [facility], Leita DEL, MD

## 2023-10-07 ENCOUNTER — Other Ambulatory Visit: Payer: Self-pay | Admitting: Internal Medicine

## 2023-10-07 DIAGNOSIS — F411 Generalized anxiety disorder: Secondary | ICD-10-CM

## 2023-10-09 NOTE — Telephone Encounter (Signed)
 Requested Prescriptions  Pending Prescriptions Disp Refills   escitalopram  (LEXAPRO ) 10 MG tablet [Pharmacy Med Name: ESCITALOPRAM  10 MG TABLET] 90 tablet 1    Sig: TAKE 1 TABLET BY MOUTH EVERY DAY     Psychiatry:  Antidepressants - SSRI Passed - 10/09/2023  8:54 AM      Passed - Valid encounter within last 6 months    Recent Outpatient Visits           2 weeks ago Annual physical exam   [facility] at [facility], Leita DEL, MD

## 2023-12-07 DIAGNOSIS — M170 Bilateral primary osteoarthritis of knee: Secondary | ICD-10-CM | POA: Diagnosis not present

## 2023-12-07 DIAGNOSIS — Z79899 Other long term (current) drug therapy: Secondary | ICD-10-CM | POA: Diagnosis not present

## 2023-12-15 ENCOUNTER — Other Ambulatory Visit: Payer: Self-pay | Admitting: Internal Medicine

## 2023-12-15 DIAGNOSIS — I10 Essential (primary) hypertension: Secondary | ICD-10-CM

## 2023-12-18 NOTE — Telephone Encounter (Signed)
 Requested Prescriptions  Pending Prescriptions Disp Refills   hydrochlorothiazide  (HYDRODIURIL ) 25 MG tablet [Pharmacy Med Name: HYDROCHLOROTHIAZIDE  25 MG TAB] 90 tablet 0    Sig: TAKE 1 TABLET (25 MG TOTAL) BY MOUTH DAILY.     Cardiovascular: Diuretics - Thiazide Failed - 12/18/2023 10:36 AM      Failed - Cr in normal range and within 180 days    Creatinine, Ser  Date Value Ref Range Status  09/20/2023 0.56 (L) 0.57 - 1.00 mg/dL Final         Passed - K in normal range and within 180 days    Potassium  Date Value Ref Range Status  09/20/2023 3.7 3.5 - 5.2 mmol/L Final         Passed - Na in normal range and within 180 days    Sodium  Date Value Ref Range Status  09/20/2023 138 134 - 144 mmol/L Final         Passed - Last BP in normal range    BP Readings from Last 1 Encounters:  09/20/23 120/76         Passed - Valid encounter within last 6 months    Recent Outpatient Visits           2 months ago Annual physical exam   [facility] at [facility], Leita DEL, MD

## 2024-01-07 DIAGNOSIS — L821 Other seborrheic keratosis: Secondary | ICD-10-CM | POA: Diagnosis not present

## 2024-01-07 DIAGNOSIS — L918 Other hypertrophic disorders of the skin: Secondary | ICD-10-CM | POA: Diagnosis not present

## 2024-01-07 DIAGNOSIS — L738 Other specified follicular disorders: Secondary | ICD-10-CM | POA: Diagnosis not present

## 2024-01-07 DIAGNOSIS — D485 Neoplasm of uncertain behavior of skin: Secondary | ICD-10-CM | POA: Diagnosis not present

## 2024-01-14 ENCOUNTER — Ambulatory Visit
Admission: RE | Admit: 2024-01-14 | Discharge: 2024-01-14 | Disposition: A | Discharge status: Home / Self Care | Source: Ambulatory Visit | Attending: Internal Medicine | Admitting: Internal Medicine

## 2024-01-14 ENCOUNTER — Ambulatory Visit: Payer: Self-pay | Admitting: Internal Medicine

## 2024-01-14 DIAGNOSIS — Z1231 Encounter for screening mammogram for malignant neoplasm of breast: Secondary | ICD-10-CM | POA: Insufficient documentation

## 2024-01-14 DIAGNOSIS — Z780 Asymptomatic menopausal state: Secondary | ICD-10-CM | POA: Insufficient documentation

## 2024-03-20 ENCOUNTER — Encounter: Payer: Self-pay | Admitting: Student

## 2024-03-20 ENCOUNTER — Ambulatory Visit (INDEPENDENT_AMBULATORY_CARE_PROVIDER_SITE_OTHER): Admitting: Student

## 2024-03-20 VITALS — BP 130/70 | HR 62 | Temp 98.1°F | Ht 62.0 in | Wt 190.2 lb

## 2024-03-20 DIAGNOSIS — F411 Generalized anxiety disorder: Secondary | ICD-10-CM

## 2024-03-20 DIAGNOSIS — I10 Essential (primary) hypertension: Secondary | ICD-10-CM | POA: Diagnosis not present

## 2024-03-20 DIAGNOSIS — E782 Mixed hyperlipidemia: Secondary | ICD-10-CM | POA: Diagnosis not present

## 2024-03-20 DIAGNOSIS — M170 Bilateral primary osteoarthritis of knee: Secondary | ICD-10-CM

## 2024-03-20 DIAGNOSIS — M85852 Other specified disorders of bone density and structure, left thigh: Secondary | ICD-10-CM | POA: Diagnosis not present

## 2024-03-20 DIAGNOSIS — M85851 Other specified disorders of bone density and structure, right thigh: Secondary | ICD-10-CM | POA: Diagnosis not present

## 2024-03-20 DIAGNOSIS — M8580 Other specified disorders of bone density and structure, unspecified site: Secondary | ICD-10-CM | POA: Insufficient documentation

## 2024-03-20 MED ORDER — POTASSIUM CHLORIDE ER 10 MEQ PO TBCR: 10.0000 meq | EXTENDED_RELEASE_TABLET | Freq: Every day | ORAL | 1 refills | Status: AC

## 2024-03-20 NOTE — Assessment & Plan Note (Addendum)
 Has been on lexapro  for about 2 years,  feels it has been helpful or mood. Anxiety is mostly related to situation changes such as husband's health, stress work, taking care of of elderly dogs.  She has considered coming off medication.  Discussed pursuing counseling through psychology today.  If mood is stable could consider discontinuing Lexapro .

## 2024-03-20 NOTE — Assessment & Plan Note (Addendum)
 BP well controlled on hydrochlorothiazide  25 mg daily. Reprots home pressure 122/78 lately.  Stable CMP on 09/2023..  Mild hypokalemia in the past currently on supplementation.  Will check labs at next visit if potassium we will discuss discontinue potassium supplementation. Continue hydrochlorothiazide  25 mg daily and potassium chloride  10 mEq daily

## 2024-03-20 NOTE — Progress Notes (Signed)
 "  Established Patient Office Visit  Subjective   Patient ID: Jessica Mcgee, female    DOB: [birth year]  Age: 74 y.o. MRN: [id number]  Chief Complaint  Patient presents with   Hypertension    Jessica Mcgee is a 74 y.o. person with medical hx listed below who presents today for follow up of depression and hypertension. Please refer to problem based charting for further details and assessment and plan of current problem and chronic medical conditions.  Patient Active Problem List   Diagnosis Date Noted   Osteopenia 03/20/2024   Generalized anxiety disorder 01/30/2022   Trigger middle finger of left hand 01/20/2021   Primary osteoarthritis of both hands 01/20/2021   Hyperlipidemia, mixed 05/08/2019   History of Lyme disease 03/31/2019   Osteoarthritis of both knees 07/04/2017   Hemangioma of intra-abdominal structures 04/19/2016   GERD (gastroesophageal reflux disease) 10/07/2015   Essential hypertension 02/10/2015   Fibromyalgia 08/25/2014      ROS Refer to HPI    Objective:     Outpatient Encounter Medications as of 03/20/2024  Medication Sig Note   Cholecalciferol (VITAMIN D -1000 MAX ST) 25 MCG (1000 UT) tablet Take 1,000 Units by mouth daily.    escitalopram  (LEXAPRO ) 10 MG tablet TAKE 1 TABLET BY MOUTH EVERY DAY    hydrochlorothiazide  (HYDRODIURIL ) 25 MG tablet TAKE 1 TABLET (25 MG TOTAL) BY MOUTH DAILY.    HYDROcodone-acetaminophen  (NORCO) 10-325 MG tablet Take 0.5-1 tablets by mouth daily as needed. 08/15/2023: 1/2 tablet prn   Milk Thistle 175 MG CAPS Take 1 capsule by mouth in the morning and at bedtime.    Multiple Vitamins-Minerals (CENTRUM ADULTS) TABS Take by mouth.    omeprazole (PRILOSEC OTC) 20 MG tablet Take 20 mg by mouth daily. (Patient taking differently: Take 20 mg by mouth as needed.)    S-Adenosylmethionine (SAM-E PO) Take by mouth. 08/15/2023: 1 tablet daily   [DISCONTINUED] potassium chloride  (KLOR-CON ) 10 MEQ tablet TAKE 1 TABLET BY MOUTH EVERY DAY     potassium chloride  (KLOR-CON ) 10 MEQ tablet Take 1 tablet (10 mEq total) by mouth daily.    No facility-administered encounter medications on file as of 03/20/2024.    BP 130/70   Pulse 62   Temp 98.1 F (36.7 C) (Oral)   Ht 5' 2 (1.575 m)   Wt 190 lb 4 oz (86.3 kg)   SpO2 98%   BMI 34.80 kg/m  BP Readings from Last 3 Encounters:  03/20/24 130/70  09/20/23 120/76  01/15/23 126/78    Physical Exam     03/20/2024   10:32 AM 09/20/2023    9:55 AM 08/15/2023    1:29 PM  Depression screen PHQ 2/9  Decreased Interest 2 3 1   Down, Depressed, Hopeless 1 3 1   PHQ - 2 Score 3 6 2   Altered sleeping 2 3 1   Tired, decreased energy 3 3 1   Change in appetite 1 0 0  Feeling bad or failure about yourself  0 0 0  Trouble concentrating 1 0 0  Moving slowly or fidgety/restless 0 0 0  Suicidal thoughts 0 0 0  PHQ-9 Score 10 12  4    Difficult doing work/chores Somewhat difficult Not difficult at all Somewhat difficult     Data saved with a previous flowsheet row definition       03/20/2024   10:32 AM 09/20/2023    9:55 AM 01/15/2023    2:52 PM 08/02/2022    9:54 AM  GAD 7 : Generalized  Anxiety Score  Nervous, Anxious, on Edge 1  1  3   0   Control/stop worrying 1  1  3   0   Worry too much - different things 1  1  3   0   Trouble relaxing 1  0  3  0   Restless 0  0  3  0   Easily annoyed or irritable 0  0  3  0   Afraid - awful might happen 1  0  3  0   Total GAD 7 Score 5 3 21  0  Anxiety Difficulty Somewhat difficult Somewhat difficult Extremely difficult Not difficult at all     Data saved with a previous flowsheet row definition    No results found for any visits on 03/20/24.    The 10-year ASCVD risk score (Arnett DK, et al., 2019) is: 13.5%    Assessment & Plan:  Generalized anxiety disorder Assessment & Plan: Has been on lexapro  for about 2 years,  feels it has been helpful or mood. Anxiety is mostly related to situation changes such as husband's health, stress  work, taking care of of elderly dogs.  She has considered coming off medication.  Discussed pursuing counseling through psychology today.  If mood is stable could consider discontinuing Lexapro .   Essential hypertension Assessment & Plan: BP well controlled on hydrochlorothiazide  25 mg daily. Reprots home pressure 122/78 lately.  Stable CMP on 09/2023..  Mild hypokalemia in the past currently on supplementation.  Will check labs at next visit if potassium we will discuss discontinue potassium supplementation. Continue hydrochlorothiazide  25 mg daily and potassium chloride  10 mEq daily  Orders: -     Potassium Chloride  ER; Take 1 tablet (10 mEq total) by mouth daily.  Dispense: 90 tablet; Refill: 1  Primary osteoarthritis of both knees Assessment & Plan: Managed by [facility] with pain management with hydrocodone-acetaminophen  10-325 mg as needed.   Osteopenia of necks of both femurs Assessment & Plan: Of the b/l femoral next on DXA in 2025, is supplementing calcium and vitamin D .  Encouraged weightbearing exercise twice a week.   Hyperlipidemia, mixed Assessment & Plan: The 10-year ASCVD risk score (Arnett DK, et al., 2019) is: 13.5% uninterested in starting statin medication.  Repeat lipid profile at next visit.      Return in about 6 months (around 09/17/2024) for physical.    Jessica Saddler, MD "

## 2024-03-20 NOTE — Assessment & Plan Note (Addendum)
 Of the b/l femoral next on DXA in 2025, is supplementing calcium and vitamin D .  Encouraged weightbearing exercise twice a week.

## 2024-03-20 NOTE — Assessment & Plan Note (Addendum)
 Managed by [facility] with pain management with hydrocodone-acetaminophen  10-325 mg as needed.

## 2024-03-29 NOTE — Assessment & Plan Note (Signed)
 The 10-year ASCVD risk score (Arnett DK, et al., 2019) is: 13.5% uninterested in starting statin medication.  Repeat lipid profile at next visit.

## 2024-04-09 ENCOUNTER — Encounter: Payer: Self-pay | Admitting: Student

## 2024-04-09 ENCOUNTER — Ambulatory Visit: Admitting: Student

## 2024-04-09 VITALS — BP 140/78 | HR 81 | Temp 98.4°F | Ht 62.0 in | Wt 190.5 lb

## 2024-04-09 DIAGNOSIS — J0100 Acute maxillary sinusitis, unspecified: Secondary | ICD-10-CM

## 2024-04-09 MED ORDER — AMOXICILLIN-POT CLAVULANATE 875-125 MG PO TABS: 1.0000 | ORAL_TABLET | Freq: Two times a day (BID) | ORAL | 0 refills | Status: AC

## 2024-04-09 MED ORDER — GUAIFENESIN-CODEINE 100-10 MG/5ML PO SOLN: 5.0000 mL | Freq: Three times a day (TID) | ORAL | 0 refills | Status: AC | PRN

## 2024-04-09 NOTE — Progress Notes (Signed)
 "  Established Patient Office Visit  Subjective   Patient ID: Jessica Mcgee, female    DOB: 20-Nov-1950  Age: 74 y.o. MRN: [id number]  Chief Complaint  Patient presents with   Cough    Over a week of coughing and flu symptoms. Getting worse.     Jessica Mcgee is a 74 y.o. person with medical hx listed below who presents today for cough that started 9-10 days ago. Associated postnasal drip, sinus pain, and ear pain. Cough is non productive. Symptoms acutely worsened yesterday.  Report sx started shortly after husband got sick from a virus at work. Has been using a humidifier, mucinex , and dayquill without relief.  No fever, dyspnea, chest pain, n/v/d, abomidnal pain, or wheezing.    Patient Active Problem List   Diagnosis Date Noted   Osteopenia 03/20/2024   Generalized anxiety disorder 01/30/2022   Trigger middle finger of left hand 01/20/2021   Primary osteoarthritis of both hands 01/20/2021   Hyperlipidemia, mixed 05/08/2019   History of Lyme disease 03/31/2019   Osteoarthritis of both knees 07/04/2017   Hemangioma of intra-abdominal structures 04/19/2016   GERD (gastroesophageal reflux disease) 10/07/2015   Essential hypertension 02/10/2015   Fibromyalgia 08/25/2014      ROS Refer to HPI    Objective:     Outpatient Encounter Medications as of 04/09/2024  Medication Sig Note   amoxicillin -clavulanate (AUGMENTIN ) 875-125 MG tablet Take 1 tablet by mouth 2 (two) times daily for 5 days.    Cholecalciferol (VITAMIN D -1000 MAX ST) 25 MCG (1000 UT) tablet Take 1,000 Units by mouth daily.    escitalopram  (LEXAPRO ) 10 MG tablet TAKE 1 TABLET BY MOUTH EVERY DAY    guaiFENesin -codeine  100-10 MG/5ML syrup Take 5 mLs by mouth 3 (three) times daily as needed for up to 5 days for cough. Do not take if taking hydrocodone for pain    hydrochlorothiazide  (HYDRODIURIL ) 25 MG tablet TAKE 1 TABLET (25 MG TOTAL) BY MOUTH DAILY.    HYDROcodone-acetaminophen  (NORCO) 10-325 MG tablet Take  0.5-1 tablets by mouth daily as needed. 08/15/2023: 1/2 tablet prn   Milk Thistle 175 MG CAPS Take 1 capsule by mouth in the morning and at bedtime.    Multiple Vitamins-Minerals (CENTRUM ADULTS) TABS Take by mouth.    omeprazole (PRILOSEC OTC) 20 MG tablet Take 20 mg by mouth daily. (Patient taking differently: Take 20 mg by mouth as needed.)    potassium chloride  (KLOR-CON ) 10 MEQ tablet Take 1 tablet (10 mEq total) by mouth daily.    S-Adenosylmethionine (SAM-E PO) Take by mouth. 08/15/2023: 1 tablet daily   No facility-administered encounter medications on file as of 04/09/2024.    BP (!) 140/78   Pulse 81   Temp 98.4 F (36.9 C) (Oral)   Ht 5' 2 (1.575 m)   Wt 190 lb 8 oz (86.4 kg)   SpO2 97%   BMI 34.84 kg/m  BP Readings from Last 3 Encounters:  04/09/24 (!) 140/78  03/20/24 130/70  09/20/23 120/76    Physical Exam Constitutional:      Appearance: Normal appearance.  HENT:     Right Ear: Tympanic membrane normal.     Left Ear: Tympanic membrane normal.     Nose:     Right Sinus: Maxillary sinus tenderness and frontal sinus tenderness present.     Left Sinus: Maxillary sinus tenderness and frontal sinus tenderness present.     Mouth/Throat:     Mouth: Mucous membranes are moist.  Pharynx: Oropharynx is clear. Posterior oropharyngeal erythema present. No oropharyngeal exudate.  Eyes:     Extraocular Movements: Extraocular movements intact.     Pupils: Pupils are equal, round, and reactive to light.  Cardiovascular:     Rate and Rhythm: Normal rate and regular rhythm.     Pulses: Normal pulses.  Pulmonary:     Effort: Pulmonary effort is normal.     Breath sounds: No rhonchi or rales.  Abdominal:     General: Abdomen is flat. Bowel sounds are normal. There is no distension.     Palpations: Abdomen is soft.     Tenderness: There is no abdominal tenderness.  Musculoskeletal:        General: Normal range of motion.     Right lower leg: No edema.     Left lower  leg: No edema.  Lymphadenopathy:     Cervical: No cervical adenopathy.  Skin:    General: Skin is warm and dry.     Capillary Refill: Capillary refill takes less than 2 seconds.  Neurological:     General: No focal deficit present.     Mental Status: She is alert and oriented to person, place, and time.  Psychiatric:        Mood and Affect: Mood normal.        Behavior: Behavior normal.        04/09/2024    2:02 PM 03/20/2024   10:32 AM 09/20/2023    9:55 AM  Depression screen PHQ 2/9  Decreased Interest 0 2 3  Down, Depressed, Hopeless 0 1 3  PHQ - 2 Score 0 3 6  Altered sleeping  2 3  Tired, decreased energy  3 3  Change in appetite  1 0  Feeling bad or failure about yourself   0 0  Trouble concentrating  1 0  Moving slowly or fidgety/restless  0 0  Suicidal thoughts  0 0  PHQ-9 Score  10 12   Difficult doing work/chores  Somewhat difficult Not difficult at all     Data saved with a previous flowsheet row definition       04/09/2024    2:02 PM 03/20/2024   10:32 AM 09/20/2023    9:55 AM 01/15/2023    2:52 PM  GAD 7 : Generalized Anxiety Score  Nervous, Anxious, on Edge 0 1  1  3    Control/stop worrying 0 1  1  3    Worry too much - different things  1  1  3    Trouble relaxing  1  0  3   Restless  0  0  3   Easily annoyed or irritable  0  0  3   Afraid - awful might happen  1  0  3   Total GAD 7 Score  5 3 21   Anxiety Difficulty  Somewhat difficult Somewhat difficult Extremely difficult     Data saved with a previous flowsheet row definition    No results found for any visits on 04/09/24.  Last CBC Lab Results  Component Value Date   WBC 6.2 09/20/2023   HGB 13.1 09/20/2023   HCT 40.2 09/20/2023   MCV 91 09/20/2023   MCH 29.7 09/20/2023   RDW 12.9 09/20/2023   PLT 263 09/20/2023   Last metabolic panel Lab Results  Component Value Date   GLUCOSE 94 09/20/2023   NA 138 09/20/2023   K 3.7 09/20/2023   CL 99 09/20/2023   CO2 24 09/20/2023  BUN 14  09/20/2023   CREATININE 0.56 (L) 09/20/2023   EGFR 98 09/20/2023   CALCIUM 9.7 09/20/2023   PROT 6.4 09/20/2023   ALBUMIN 4.1 09/20/2023   LABGLOB 2.3 09/20/2023   AGRATIO 1.5 08/02/2022   BILITOT 0.4 09/20/2023   ALKPHOS 42 (L) 09/20/2023   AST 18 09/20/2023   ALT 17 09/20/2023   Last lipids Lab Results  Component Value Date   CHOL 185 09/20/2023   HDL 77 09/20/2023   LDLCALC 95 09/20/2023   TRIG 71 09/20/2023   CHOLHDL 2.4 09/20/2023   Last hemoglobin A1c No results found for: HGBA1C Last thyroid  functions Lab Results  Component Value Date   TSH 3.090 09/20/2023   Last vitamin D  Lab Results  Component Value Date   VD25OH 41.9 08/25/2014      The 10-year ASCVD risk score (Arnett DK, et al., 2019) is: 15.4%    Assessment & Plan:  Acute non-recurrent maxillary sinusitis Suspect secondary bacterial infection given sudden worsening of symptoms after 1 week. Treat with Augmentin . Recommend saline nasal rinses. She has not needed hydrocodone for knee pain lately will give codeine  cough syrup, she will not take this with hydrocodone.  Other orders -     Amoxicillin -Pot Clavulanate; Take 1 tablet by mouth 2 (two) times daily for 5 days.  Dispense: 10 tablet; Refill: 0 -     guaiFENesin -Codeine ; Take 5 mLs by mouth 3 (three) times daily as needed for up to 5 days for cough. Do not take if taking hydrocodone for pain  Dispense: 75 mL; Refill: 0   Return if symptoms worsen or fail to improve.    Harlene Saddler, MD "

## 2024-08-20 ENCOUNTER — Ambulatory Visit

## 2024-10-01 ENCOUNTER — Encounter: Admitting: Student
